# Patient Record
Sex: Female | Born: 1950 | Race: White | Hispanic: No | Marital: Married
Health system: Midwestern US, Community
[De-identification: ages and names within clinical notes are randomized; demographics above are authoritative.]

## PROBLEM LIST (undated history)

## (undated) DIAGNOSIS — E785 Hyperlipidemia, unspecified: Secondary | ICD-10-CM

## (undated) DIAGNOSIS — E039 Hypothyroidism, unspecified: Secondary | ICD-10-CM

## (undated) DIAGNOSIS — F32A Depression, unspecified: Secondary | ICD-10-CM

## (undated) DIAGNOSIS — F419 Anxiety disorder, unspecified: Secondary | ICD-10-CM

## (undated) DIAGNOSIS — I10 Essential (primary) hypertension: Secondary | ICD-10-CM

---

## 2011-12-30 LAB — CBC WITH AUTO DIFFERENTIAL
Basophils %: 0.8 % (ref 0–1)
Basophils Absolute: 0 10*3/uL (ref 0.0–0.1)
Eosinophils %: 3.5 % (ref 0–4)
Eosinophils Absolute: 0.2 10*3/uL (ref 0.0–0.6)
Hematocrit: 39.3 % (ref 37–47)
Hemoglobin: 13.2 GM/DL (ref 12.0–16.0)
Lymphocytes %: 44.5 % — ABNORMAL HIGH (ref 22–40)
Lymphocytes Absolute: 2.5 10*3/uL (ref 0.6–4.3)
MCH: 30.3 PG (ref 28–34)
MCHC: 33.5 % (ref 32.0–36.0)
MCV: 90.3 FL (ref 82–101)
MPV: 8.3 FL (ref 7.5–11.1)
Monocytes %: 6.1 % (ref 5–9)
Monocytes Absolute: 0.3 10*3/uL (ref 0.2–1.1)
Platelets: 387 10*3/uL (ref 140–440)
RBC: 4.35 10*6/uL (ref 4.2–5.5)
RDW: 13.7 % (ref 11.7–14.9)
Segs Absolute: 2.5 10*3/uL (ref 1.5–7.0)
Segs Relative: 45.1 % — ABNORMAL LOW (ref 49–69)
WBC: 5.6 10*3/uL (ref 4.5–11.0)

## 2011-12-30 LAB — COMPREHENSIVE METABOLIC PANEL
ALT: 38 U/L — ABNORMAL HIGH (ref 7–35)
AST: 30 U/L (ref 8–33)
Albumin: 4.8 G/DL (ref 3.4–4.8)
Alkaline Phosphatase: 69 U/L (ref 25–100)
BUN: 13 mg/dL (ref 6–23)
CO2: 25 mMol/L (ref 20–32)
Calcium: 9.7 mg/dL (ref 8.3–10.6)
Chloride: 103 mMol/L (ref 99–109)
Creatinine: 0.6 mg/dL (ref 0.6–1.1)
GFR African American: >60 mL/min/{1.73_m2}
GFR Non-African American: >60 mL/min/{1.73_m2}
Glucose, GTT - Fasting: 83 MG/DL (ref 70–99)
Potassium: 4.4 MMOL/L (ref 3.5–5.1)
Sodium: 138 mmol/L (ref 136–145)
Total Bilirubin: 0.4 mg/dL (ref 0.3–1.2)
Total Protein: 7.5 g/dL (ref 6.4–8.3)

## 2011-12-30 LAB — URINALYSIS
Bacteria, UA: NEGATIVE /HPF
Bilirubin Urine: NEGATIVE MG/DL
Blood, Urine: NEGATIVE
Cast Type: NONE SEEN /LPF
Crystal Type: NONE SEEN /HPF
Epithelial Cells, UA: NONE SEEN /HPF
Glucose, Urine: NEGATIVE MG/DL
Ketones, Urine: NEGATIVE MG/DL
Leukocyte Esterase, Urine: NEGATIVE
Nitrite Urine, Quantitative: NEGATIVE
Protein, UA: NEGATIVE MG/DL
RBC, UA: NONE SEEN /HPF (ref 0–6)
Specific Gravity, UA: 1.006 (ref 1.001–1.035)
Urobilinogen, Urine: 0.2 EU/DL (ref 0.2–1.0)
Volume, (UVOL): 12 ML (ref 10–12)
WBC, UA: 1 /HPF (ref 0–5)
pH, Urine: 5.5 (ref 5.0–8.0)

## 2011-12-30 LAB — TSH: TSH, High Sensitivity: 2.53 u[IU]/mL (ref 0.55–4.78)

## 2011-12-30 LAB — LIPID PANEL
Cholesterol: 189 MG/DL (ref <200)
HDL: 65 MG/DL (ref >60)
LDL Direct: 105 MG/DL — ABNORMAL HIGH (ref <100)
Triglycerides: 139 MG/DL (ref <150)

## 2011-12-30 LAB — T4, FREE: T4 Free: 1.4 NG/DL (ref 0.89–1.76)

## 2011-12-30 MED ORDER — TOPIRAMATE 25 MG PO TABS
25 MG | ORAL_TABLET | Freq: Every evening | ORAL | Status: DC
Start: 2011-12-30 — End: 2012-03-16

## 2011-12-30 MED ORDER — DIAZEPAM 5 MG PO TABS
5 MG | ORAL_TABLET | Freq: Two times a day (BID) | ORAL | Status: DC | PRN
Start: 2011-12-30 — End: 2012-04-15

## 2011-12-30 MED ORDER — FENOFIBRATE 145 MG PO TABS
145 MG | ORAL_TABLET | Freq: Every day | ORAL | Status: DC
Start: 2011-12-30 — End: 2012-04-15

## 2011-12-30 MED ORDER — EPINEPHRINE 0.3 MG/0.3ML IJ DEVI
0.3 MG/ML | INTRAMUSCULAR | Status: DC
Start: 2011-12-30 — End: 2013-04-03

## 2011-12-30 MED ORDER — FLUOXETINE HCL 10 MG PO CAPS
10 MG | ORAL_CAPSULE | Freq: Every day | ORAL | Status: DC
Start: 2011-12-30 — End: 2012-04-15

## 2011-12-30 MED ORDER — LEVOTHYROXINE SODIUM 125 MCG PO TABS
125 MCG | ORAL_TABLET | Freq: Every day | ORAL | Status: DC
Start: 2011-12-30 — End: 2012-04-15

## 2011-12-30 MED ORDER — ENALAPRIL MALEATE 10 MG PO TABS
10 MG | ORAL_TABLET | Freq: Every day | ORAL | Status: DC
Start: 2011-12-30 — End: 2012-04-15

## 2011-12-30 MED ORDER — ESOMEPRAZOLE MAGNESIUM 40 MG PO CPDR
40 MG | ORAL_CAPSULE | Freq: Every day | ORAL | Status: DC
Start: 2011-12-30 — End: 2012-04-15

## 2011-12-30 NOTE — Progress Notes (Signed)
Subjective:      Patient ID: Kimberly Morrow is a 61 y.o. female.    HPI Comments: Pt presenting today to get established in the new office.  States she continues to be under a tremendous amt of stress with her husband's poor health, her own weight issues, regular headaches, fatigue and shortness of breath.  She is not able to exercise since fracturing her right ankle last August and has been unable to lose weight despite trying to "eat healthy."  She is taking her medications as prescribed and does not want to make any changes to her Prozac currently.  She is very fatigued and tearful.  She wants to move to [place] permanently.  She is having some spasmodic pain in her bladder when she is urinating.  Dr. Delray Alt ordered a urine dip in his office last week and per pt was told it was "negative."  She is still having symptoms.  She denies hematuria.  She is drinking tea and vegetable shakes daily.      Review of Systems   Constitutional: Positive for activity change (decreased) and fatigue. Negative for unexpected weight change.   HENT: Negative for congestion and mouth sores.    Respiratory: Negative for cough.    Cardiovascular: Negative for chest pain.   Gastrointestinal: Positive for abdominal pain (lower, suprapubic).   Genitourinary: Positive for dysuria and urgency. Negative for hematuria and difficulty urinating.   Musculoskeletal: Positive for joint swelling (left ankle region). Negative for back pain.   Allergic/Immunologic: Positive for food allergies.   Neurological: Positive for headaches. Negative for dizziness and weakness.   Psychiatric/Behavioral: Positive for dysphoric mood and agitation. Negative for suicidal ideas and sleep disturbance. The patient is nervous/anxious.        Objective:   Physical Exam   Constitutional: She is oriented to person, place, and time. She appears well-developed and well-nourished. No distress.   HENT:   Head: Normocephalic and atraumatic.   Neck: Normal range of motion.  Neck supple. No thyromegaly present.   Cardiovascular: Normal rate, regular rhythm and normal heart sounds.    No murmur heard.  Pulmonary/Chest: Effort normal and breath sounds normal.   Abdominal: Soft. There is no tenderness.   Musculoskeletal: Normal range of motion. She exhibits edema (left ankle with mild non-pitting edema) and tenderness.   Negative Homan's sign   Neurological: She is alert and oriented to person, place, and time.   Skin: Skin is warm and dry.   Psychiatric: Her mood appears anxious. Her speech is not rapid and/or pressured. She is agitated. She expresses impulsivity. She expresses no suicidal ideation. She expresses no suicidal plans.   teaerful       1. Fatigue  Vitamin D 25 hydroxy   2. Anxiety and depression  FLUoxetine (PROZAC) 10 MG capsule, diazepam (VALIUM) 5 MG tablet   3. Unspecified hypothyroidism  levothyroxine (SYNTHROID) 125 MCG tablet, TSH, T4, free   4. Unspecified essential hypertension  enalapril (VASOTEC) 10 MG tablet, Comprehensive metabolic panel, CBC Auto Differential, Urinalysis   5. GERD (gastroesophageal reflux disease)  esomeprazole (NEXIUM) 40 MG capsule   6. Other and unspecified hyperlipidemia  fenofibrate (TRICOR) 145 MG tablet, Lipid panel   7. Bee sting-induced anaphylaxis  EPINEPHrine (EPIPEN) 0.3 MG/0.3ML DEVI injection   8. SOB (shortness of breath)  US Duplex Lower Extremity Left Ven   9. Migraine headache  topiramate (TOPAMAX) 25 MG tablet   10. Cystitis, acute        Past medical, surgical,  social, family, medication and vaccination history updated today in the chart.  Pt's multiple medical concerns were addressed today in a 50 minute visit.  She is due for labs and medication refills which were ordered and done today.  She will have [place] of her left lower extremity due to her complaints of left leg edema and shortness of breath.  I do not think she has a PE, however since she recently had cardiac w/u in the last year with Dr. Nicole Cella and was told that her  source of SOB is not cardiac, I should also work to r/o PE.  I feel that most of her symptoms are anxiety related at this time.  She will start Topamax low dose at hs for her migraine headache prophylaxis which may help with her appetite suppression as well.  She will try to resume some form of walking type exercise as well.  She has been given an Rx for epi pen and she is to avoid almond milk and eggs if she feels they are making her cough and feel like her throat is closing.  I will notify her of lab results.  She is agreeable to plan.  I will see her back in 1 month.  Discuss cancer screening at our next visit.

## 2011-12-30 NOTE — Patient Instructions (Signed)
Thank you for enrolling in MyChart. Please follow the instructions below to securely access your online medical record. MyChart allows you to send messages to your doctor, view your test results, renew your prescriptions, schedule appointments, and more.     How Do I Sign Up?  1. In your Internet browser, go to [email or web address]  2. Click on the Sign Up Now link in the Sign In box. You will see the New Member Sign Up page.  3. Enter your MyChart Access Code exactly as it appears below. You will not need to use this code after you???ve completed the sign-up process. If you do not sign up before the expiration date, you must request a new code.  MyChart Access Code: [email or web address]  Expires: 02/28/2012  1:38 PM    4. Enter your Social Security Number ([id number]) and Date of Birth (mm/dd/yyyy) as indicated and click Submit. You will be taken to the next sign-up page.  5. Create a MyChart ID. This will be your MyChart login ID and cannot be changed, so think of one that is secure and easy to remember.  6. Create a MyChart password. You can change your password at any time.  7. Enter your Password Reset Question and Answer. This can be used at a later time if you forget your password.   8. Enter your e-mail address. You will receive e-mail notification when new information is available in MyChart.  9. Click Sign Up. You can now view your medical record.     Additional Information  If you have questions, please contact your physician practice where you receive care. Remember, MyChart is NOT to be used for urgent needs. For medical emergencies, dial 911.

## 2012-01-01 LAB — CULTURE, URINE: Culture: NO GROWTH

## 2012-01-01 LAB — VITAMIN D 25 HYDROXY: Vit D, 25-Hydroxy: 27 — ABNORMAL LOW

## 2012-01-06 NOTE — Telephone Encounter (Signed)
Pt wanting to know if she can ger an Rx of Valium 5 mg.  Looks like an Rx was written on 12-30-2011 with 5 refills.  I will call the pharmacy and see if it is there.

## 2012-01-06 NOTE — Telephone Encounter (Signed)
There are no refills at the pharmacy.  Pt needs Rx

## 2012-01-06 NOTE — Telephone Encounter (Signed)
Patient called and states she does have RX.

## 2012-01-06 NOTE — Telephone Encounter (Signed)
Pt to look for the Rx that I printed out for her at her visit last week.  Thanks.

## 2012-01-06 NOTE — Telephone Encounter (Signed)
Patient will look and call us back

## 2012-03-16 MED ORDER — ERGOCALCIFEROL 1.25 MG (50000 UT) PO CAPS
1.25 MG (50000 UT) | ORAL_CAPSULE | ORAL | Status: AC
Start: 2012-03-16 — End: ?

## 2012-03-16 MED ORDER — TOPIRAMATE 50 MG PO TABS
50 MG | ORAL_TABLET | Freq: Every evening | ORAL | Status: DC
Start: 2012-03-16 — End: 2012-04-15

## 2012-03-16 NOTE — Progress Notes (Signed)
Subjective:      Patient ID: Kimberly Morrow is a 61 y.o. female.    HPI Comments: Pt has tolerated the Topamax well.  She feels it has really helped her with her sleep but she is still waking up at 3am, but the sleep she is so much better.  The thoughts are quieted down when she is falling asleep.  Pt is feeling a "burning in my neck."  Feels that her thyroid is starting to "regrow."  She is hoarse and difficult to swallow.  Feels she needs to have her "throat stretched."      Review of Systems   Constitutional: Positive for appetite change and fatigue. Negative for unexpected weight change.   HENT: Positive for trouble swallowing, neck pain ("burning where my thyroid used to be") and voice change. Negative for congestion, sore throat and mouth sores.    Respiratory: Positive for shortness of breath (with increased anxiety). Negative for cough.    Cardiovascular: Negative for chest pain.   Gastrointestinal: Negative for abdominal pain.   Genitourinary: Negative for difficulty urinating.   Musculoskeletal: Negative for back pain.   Neurological: Negative for weakness.   Psychiatric/Behavioral: Positive for sleep disturbance and dysphoric mood. Negative for suicidal ideas. The patient is nervous/anxious.        Objective:   Physical Exam   Vitals reviewed.  Constitutional: She is oriented to person, place, and time. She appears well-developed and well-nourished. No distress.   HENT:   Head: Normocephalic and atraumatic.   Neck: Normal range of motion. Neck supple. No thyromegaly (no neck mass appreciated) present.   Cardiovascular: Normal rate and regular rhythm.    No murmur heard.  Pulmonary/Chest: Effort normal and breath sounds normal.   Abdominal: Soft. There is no tenderness.   Musculoskeletal: Normal range of motion.   Neurological: She is alert and oriented to person, place, and time.   Skin: Skin is warm and dry.   Psychiatric: She has a normal mood and affect. Her behavior is normal.       1. Vitamin D  deficiency  ergocalciferol (ERGOCALCIFEROL) 50000 UNITS capsule   2. Migraine headache  topiramate (TOPAMAX) 50 MG tablet   3. Anxiety and depression     4. GERD (gastroesophageal reflux disease)  Ambulatory referral to Gastroenterology   5. Post-surgical hypothyroidism  Amb External Referral To Endocrinology   6. Voice hoarseness  Amb External Referral To Endocrinology, Ambulatory referral to Gastroenterology   7. Need for influenza vaccination  Flu Vaccine greater than or = 63yo IM     Pt will start ergocalciferol at 50,000 units weekly for her vitamin D def.  She will increase Topamax to 50mg  at hs in hopes of improved sleep, mood and headache stablility.  She will go to see Dr. Lenis Noon Appalaneni for GI eval and she will reconnect with Dr. Laurene Footman for evaluation of her history of thyroid cancer and burning in neck and voice hoarseness.  She will have flu vaccination today and I will see her back in 1 month for a recheck.  Pt is agreeable to plan.

## 2012-04-15 ENCOUNTER — Encounter

## 2012-04-15 MED ORDER — ENALAPRIL MALEATE 10 MG PO TABS
10 MG | ORAL_TABLET | Freq: Every day | ORAL | Status: DC
Start: 2012-04-15 — End: 2012-11-04

## 2012-04-15 MED ORDER — LEVOTHYROXINE SODIUM 125 MCG PO TABS
125 MCG | ORAL_TABLET | Freq: Every day | ORAL | Status: DC
Start: 2012-04-15 — End: 2013-03-17

## 2012-04-15 MED ORDER — DIAZEPAM 5 MG PO TABS
5 MG | ORAL_TABLET | Freq: Two times a day (BID) | ORAL | Status: DC | PRN
Start: 2012-04-15 — End: 2012-11-30

## 2012-04-15 MED ORDER — TOPIRAMATE 50 MG PO TABS
50 MG | ORAL_TABLET | Freq: Every evening | ORAL | Status: DC
Start: 2012-04-15 — End: 2012-11-30

## 2012-04-15 MED ORDER — ESOMEPRAZOLE MAGNESIUM 40 MG PO CPDR
40 MG | ORAL_CAPSULE | Freq: Every day | ORAL | Status: AC
Start: 2012-04-15 — End: ?

## 2012-04-15 MED ORDER — FENOFIBRATE 145 MG PO TABS
145 MG | ORAL_TABLET | Freq: Every day | ORAL | Status: DC
Start: 2012-04-15 — End: 2013-01-19

## 2012-04-15 MED ORDER — FLUOXETINE HCL 10 MG PO CAPS
10 MG | ORAL_CAPSULE | Freq: Every day | ORAL | Status: DC
Start: 2012-04-15 — End: 2012-11-30

## 2012-04-15 NOTE — Telephone Encounter (Signed)
Pt called states that she would like all her medication name brand. she states that she will pay the difference in price. Pt was wondering if she can get enough prescriptions to last awhile why she is out of town. She has tried the generic on blood pressure and it kept her BP up. She will pick the prescriptions up.

## 2012-04-15 NOTE — Telephone Encounter (Signed)
Pt notified Rx's are ready for pickup

## 2012-04-15 NOTE — Telephone Encounter (Signed)
Done.  Pt can come to pick up her prescriptions when she is ready.

## 2012-11-03 NOTE — Telephone Encounter (Signed)
Per Dr. Patsi SearsSandor called into #90[facility] [place]

## 2012-11-03 NOTE — Telephone Encounter (Signed)
Pt needs a refill on her Enalapril. It needs to be written as the generic because her insurance won't fill the Vasotec. Pt took her last one today. Her and her husband made appt to see you on 12/07/12

## 2012-11-04 ENCOUNTER — Encounter

## 2012-11-04 MED ORDER — ENALAPRIL MALEATE 10 MG PO TABS
10 MG | ORAL_TABLET | Freq: Every day | ORAL | Status: DC
Start: 2012-11-04 — End: 2014-01-16

## 2012-11-30 LAB — T4, FREE: T4 Free: 1.57 NG/DL (ref 0.89–1.76)

## 2012-11-30 LAB — COMPREHENSIVE METABOLIC PANEL
ALT: 35 U/L (ref 7–35)
AST: 33 U/L (ref 8–33)
Albumin: 4.4 G/DL (ref 3.4–4.8)
Alkaline Phosphatase: 75 U/L (ref 25–100)
BUN: 13 mg/dL (ref 6–23)
CO2: 26 mMol/L (ref 20–32)
Calcium: 9.9 mg/dL (ref 8.3–10.6)
Chloride: 112 mMol/L — ABNORMAL HIGH (ref 99–109)
Creatinine: 0.6 mg/dL (ref 0.6–1.1)
GFR African American: >60 mL/min/{1.73_m2}
GFR Non-African American: >60 mL/min/{1.73_m2}
Glucose, GTT - Fasting: 92 MG/DL (ref 70–99)
Potassium: 4.5 MMOL/L (ref 3.5–5.1)
Sodium: 143 mmol/L (ref 136–145)
Total Bilirubin: 0.3 mg/dL (ref 0.3–1.2)
Total Protein: 7.5 g/dL (ref 6.4–8.3)

## 2012-11-30 LAB — CBC WITH AUTO DIFFERENTIAL
Basophils %: 1.3 % — ABNORMAL HIGH (ref 0–1)
Basophils Absolute: 0.1 10*3/uL (ref 0.0–0.1)
Eosinophils %: 5.1 % — ABNORMAL HIGH (ref 0–4)
Eosinophils Absolute: 0.3 10*3/uL (ref 0.0–0.6)
Hematocrit: 38.9 % (ref 37–47)
Hemoglobin: 13.3 GM/DL (ref 12.0–16.0)
Lymphocytes %: 45.5 % — ABNORMAL HIGH (ref 22–40)
Lymphocytes Absolute: 2.2 10*3/uL (ref 0.6–4.3)
MCH: 31.1 PG (ref 28–34)
MCHC: 34.2 % (ref 32.0–36.0)
MCV: 91 FL (ref 82–101)
MPV: 8.1 FL (ref 7.5–11.1)
Monocytes %: 7.5 % (ref 5–9)
Monocytes Absolute: 0.4 10*3/uL (ref 0.2–1.1)
Platelets: 348 10*3/uL (ref 140–440)
RBC: 4.28 10*6/uL (ref 4.2–5.5)
RDW: 13.6 % (ref 11.7–14.9)
Segs Absolute: 2 10*3/uL (ref 1.5–7.0)
Segs Relative: 40.6 % — ABNORMAL LOW (ref 49–69)
WBC: 4.9 10*3/uL (ref 4.5–11.0)

## 2012-11-30 LAB — LIPID PANEL
Cholesterol: 171 MG/DL (ref <200)
HDL: 65 MG/DL (ref >60)
LDL Direct: 90 MG/DL (ref <100)
Triglycerides: 174 MG/DL — ABNORMAL HIGH (ref <150)

## 2012-11-30 LAB — URINALYSIS
Bacteria, UA: NEGATIVE /HPF
Bilirubin Urine: NEGATIVE MG/DL
Blood, Urine: NEGATIVE
Cast Type: NONE SEEN /LPF
Epithelial Cells, UA: NONE SEEN /HPF
Glucose, Urine: NEGATIVE MG/DL
Ketones, Urine: NEGATIVE MG/DL
Leukocyte Esterase, Urine: NEGATIVE
Mucus, UA: NEGATIVE
Nitrite Urine, Quantitative: NEGATIVE
Protein, UA: NEGATIVE MG/DL
RBC, UA: NONE SEEN /HPF (ref 0–6)
Specific Gravity, UA: 1.01 (ref 1.001–1.035)
Urobilinogen, Urine: 0.2 EU/DL (ref 0.2–1.0)
Volume, (UVOL): 12 ML (ref 10–12)
WBC, UA: 4 /HPF (ref 0–5)
pH, Urine: 7 (ref 5.0–8.0)

## 2012-11-30 LAB — T3, FREE: T3, Free: 2.8 PG/ML (ref 2.3–4.2)

## 2012-11-30 LAB — TSH: TSH, High Sensitivity: 0.272 u[IU]/mL — ABNORMAL LOW (ref 0.55–4.78)

## 2012-11-30 MED ORDER — DIAZEPAM 5 MG PO TABS
5 MG | ORAL_TABLET | Freq: Two times a day (BID) | ORAL | Status: DC | PRN
Start: 2012-11-30 — End: 2013-04-03

## 2012-11-30 MED ORDER — FLUOXETINE HCL 20 MG PO CAPS
20 MG | ORAL_CAPSULE | Freq: Every day | ORAL | Status: AC
Start: 2012-11-30 — End: ?

## 2012-11-30 MED ORDER — TOPIRAMATE 25 MG PO TABS
25 MG | ORAL_TABLET | ORAL | Status: DC
Start: 2012-11-30 — End: 2013-04-03

## 2012-11-30 NOTE — Patient Instructions (Addendum)
The 5 Love Languages -Santina Evans  The Stop Walking on [employer] Work Book  The Dance of Anger- Candise Che, PsyD [phone]
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
## 2012-12-02 LAB — VITAMIN D 25 HYDROXY: Vit D, 25-Hydroxy: 31

## 2013-01-19 ENCOUNTER — Encounter

## 2013-01-19 MED ORDER — FENOFIBRATE 145 MG PO TABS
145 MG | ORAL_TABLET | Freq: Every day | ORAL | Status: DC
Start: 2013-01-19 — End: 2013-06-30

## 2013-01-19 NOTE — Telephone Encounter (Signed)
The pharmacy called about pt tri core.  Pt insurance requires a prior auth for brand name and wanted to know if it was ok to switch to generic. LM for pt to call the office to see if it is ok to switch.

## 2013-01-19 NOTE — Telephone Encounter (Signed)
Pt called back state it is ok to change to generic.  Pharmacy notified

## 2013-03-17 ENCOUNTER — Encounter

## 2013-03-17 MED ORDER — LEVOTHYROXINE SODIUM 125 MCG PO TABS
125 MCG | ORAL_TABLET | Freq: Every day | ORAL | Status: DC
Start: 2013-03-17 — End: 2013-06-30

## 2013-03-17 NOTE — Telephone Encounter (Signed)
Rx sent to the pharmacy

## 2013-04-03 MED ORDER — DIAZEPAM 5 MG PO TABS
5 MG | ORAL_TABLET | Freq: Two times a day (BID) | ORAL | Status: DC | PRN
Start: 2013-04-03 — End: 2013-06-30

## 2013-04-03 MED ORDER — EPINEPHRINE 0.3 MG/0.3ML IJ SOAJ
0.3 MG/ML | INTRAMUSCULAR | Status: AC
Start: 2013-04-03 — End: ?

## 2013-04-03 MED ORDER — TRAZODONE HCL 50 MG PO TABS
50 MG | ORAL_TABLET | ORAL | Status: AC
Start: 2013-04-03 — End: ?
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
## 2013-07-01 MED ORDER — DIAZEPAM 5 MG PO TABS
5 MG | ORAL_TABLET | ORAL | Status: DC
Start: 2013-07-01 — End: 2013-09-10

## 2013-07-01 MED ORDER — SYNTHROID 125 MCG PO TABS
125 MCG | ORAL_TABLET | ORAL | Status: DC
Start: 2013-07-01 — End: 2013-09-10

## 2013-07-01 MED ORDER — FENOFIBRATE 145 MG PO TABS
145 MG | ORAL_TABLET | ORAL | Status: DC
Start: 2013-07-01 — End: 2014-02-04

## 2013-07-01 NOTE — Telephone Encounter (Signed)
Please call in Valium for patient to her pharmacy in [place].  Thanks.

## 2013-07-04 NOTE — Telephone Encounter (Signed)
Called pt to get the Pharmacy information.    [facility] [address] [phone].  Will call in when store open.

## 2013-07-04 NOTE — Telephone Encounter (Signed)
Rx called into the pharmacy

## 2013-09-11 MED ORDER — DIAZEPAM 5 MG PO TABS
5 MG | ORAL_TABLET | ORAL | Status: AC
Start: 2013-09-11 — End: ?

## 2013-09-11 MED ORDER — SYNTHROID 125 MCG PO TABS
125 MCG | ORAL_TABLET | ORAL | Status: DC
Start: 2013-09-11 — End: 2014-01-16

## 2013-09-11 NOTE — Telephone Encounter (Signed)
Please call in Valium to patient's pharmacy in [place].  Thanks.

## 2013-09-12 NOTE — Telephone Encounter (Signed)
Rx called into the pharmacy

## 2014-01-16 MED ORDER — ENALAPRIL MALEATE 10 MG PO TABS
10 MG | ORAL_TABLET | ORAL | Status: AC
Start: 2014-01-16 — End: ?

## 2014-01-16 MED ORDER — SYNTHROID 125 MCG PO TABS
125 MCG | ORAL_TABLET | ORAL | Status: AC
Start: 2014-01-16 — End: ?

## 2014-02-05 MED ORDER — FENOFIBRATE 145 MG PO TABS
145 MG | ORAL_TABLET | ORAL | Status: AC
Start: 2014-02-05 — End: ?

## 2014-02-05 NOTE — Telephone Encounter (Signed)
Pt will need to see me or get established with new PCP in [place].  Thanks.

## 2014-02-06 NOTE — Telephone Encounter (Signed)
I spoke with patient.  She is going to call back as soon as they know when they are coming back to [place] to schedule an appt.

## 2021-11-05 IMAGING — MG MAMMOGRAPHY SCREENING BILATERAL 3[PERSON_NAME]
8 series · 8 of 24 positions shown · non-contrast
Comparison: No prior examinations were available for comparison.

________________________________________________________________________________________________ 
MAMMOGRAPHY SCREENING BILATERAL 3ALEJTIN HELSH, 11/05/2021 [DATE]: 
CLINICAL INDICATION: Encounter for screening mammogram. New baseline screening 
mammogram. Previous bilateral breast reduction surgery.
TECHNIQUE: Digital bilateral mammograms and 3-D Tomosynthesis were obtained. 
These were interpreted both primarily and with the aid of computer-aided 
detection system.  
BREAST DENSITY: (Level C) The breasts are heterogeneously dense, which may 
obscure small masses.

[R MLO]
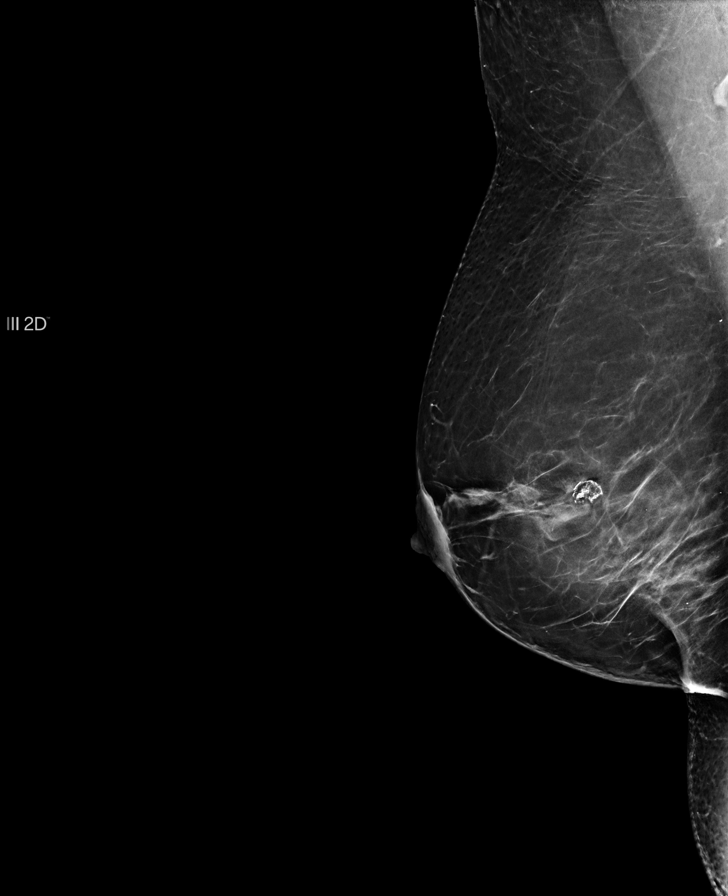

[R CC]
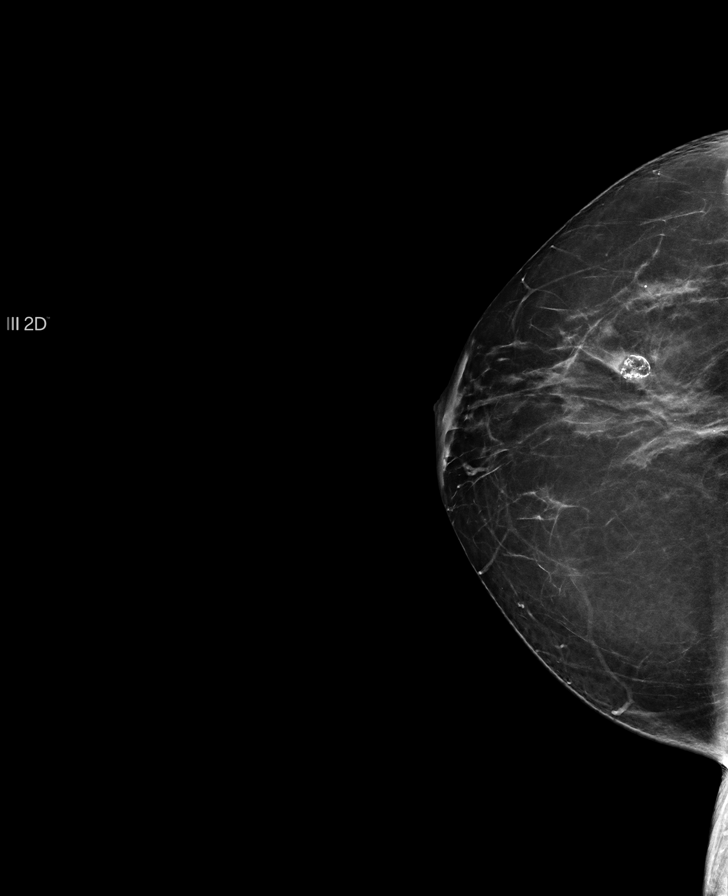

[L MLO]
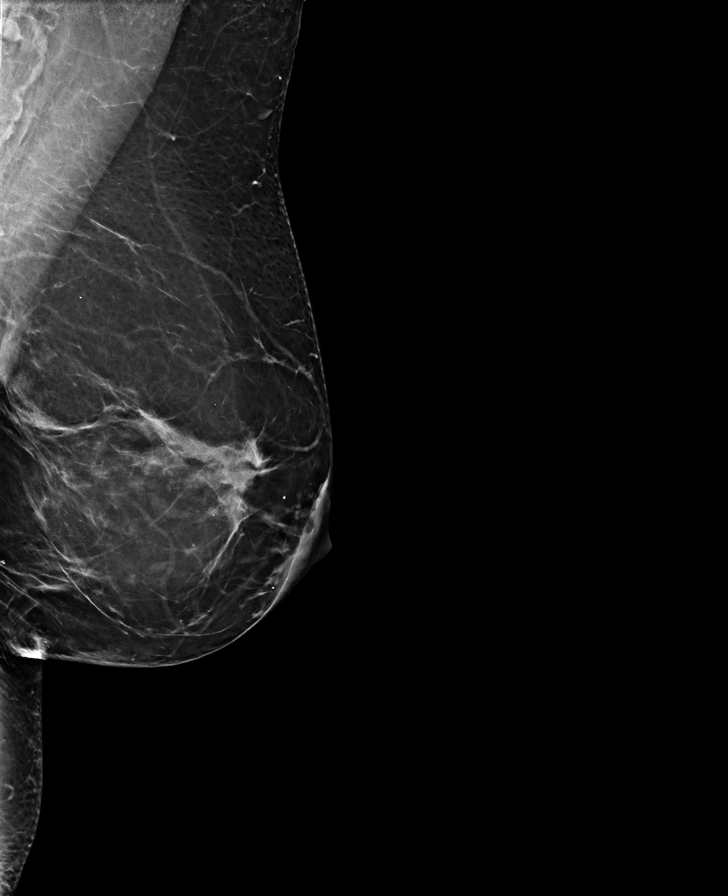

[L CC]
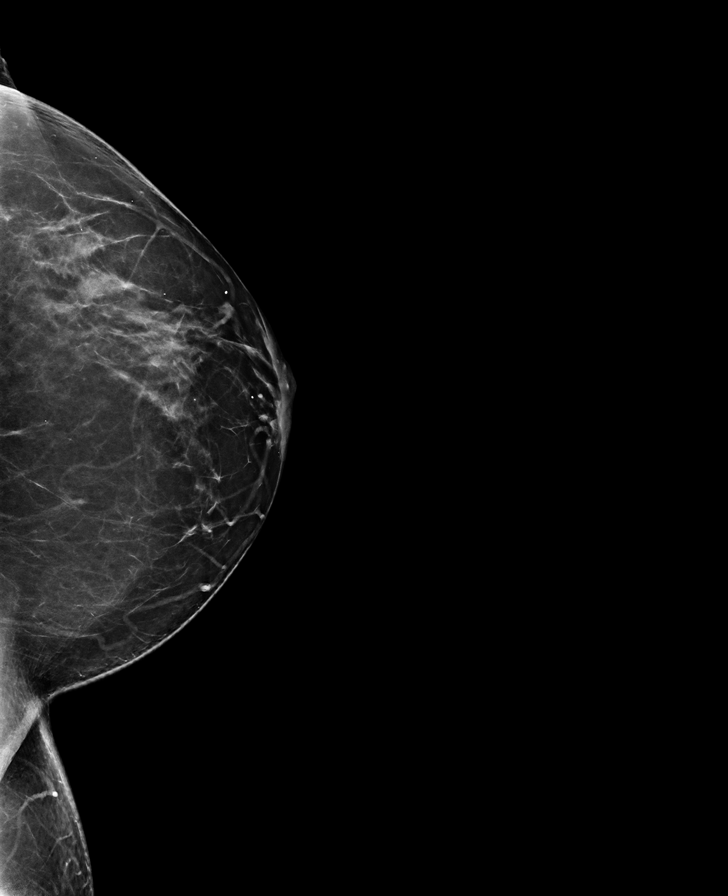

[R CC tomo · tomo slice 37/72.0]
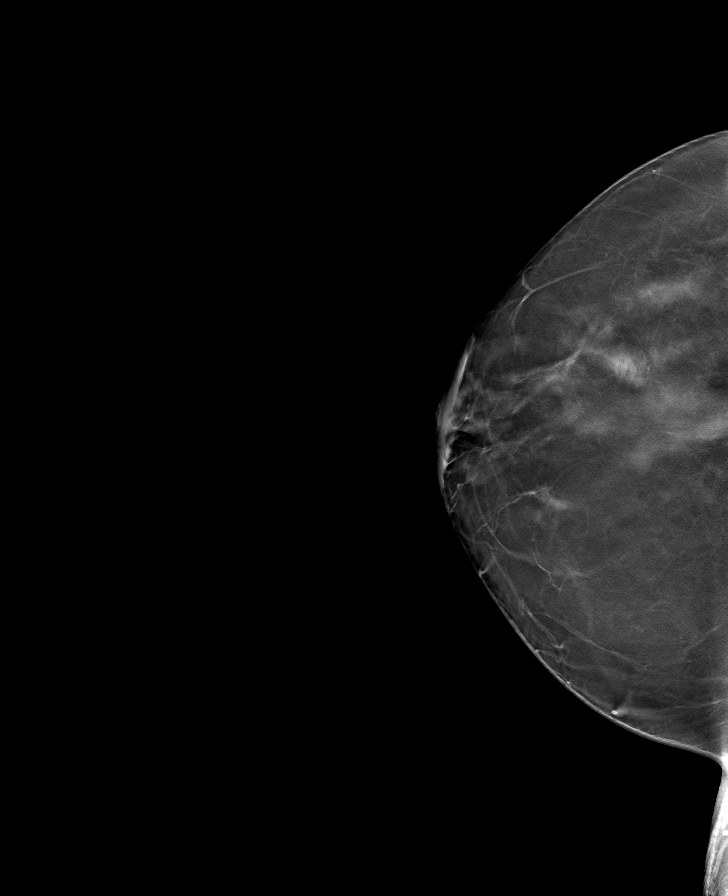

[L CC tomo · tomo slice 39/77.0]
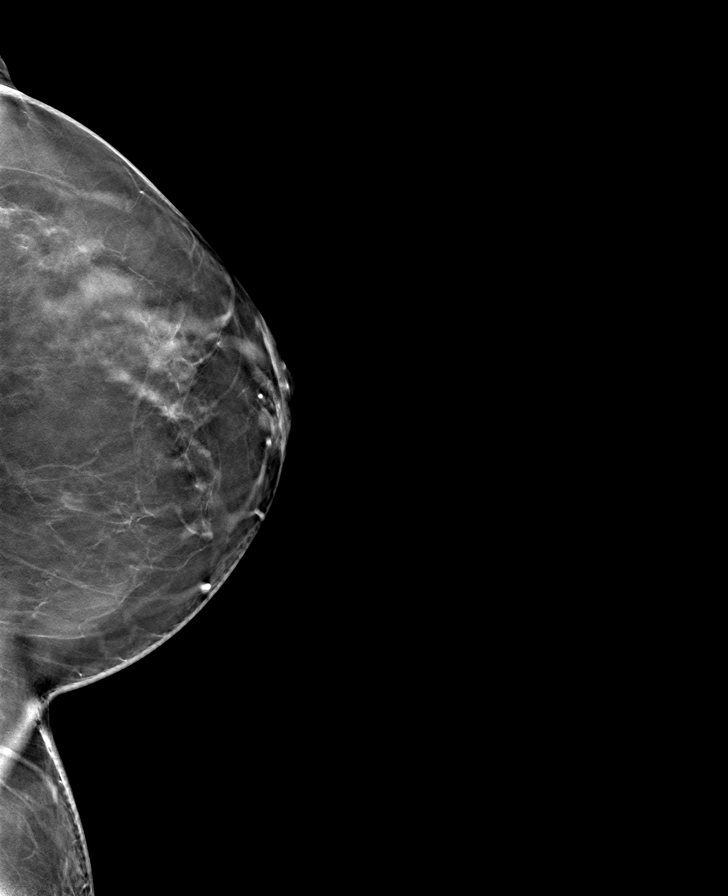

[L MLO tomo · tomo slice 43/85.0]
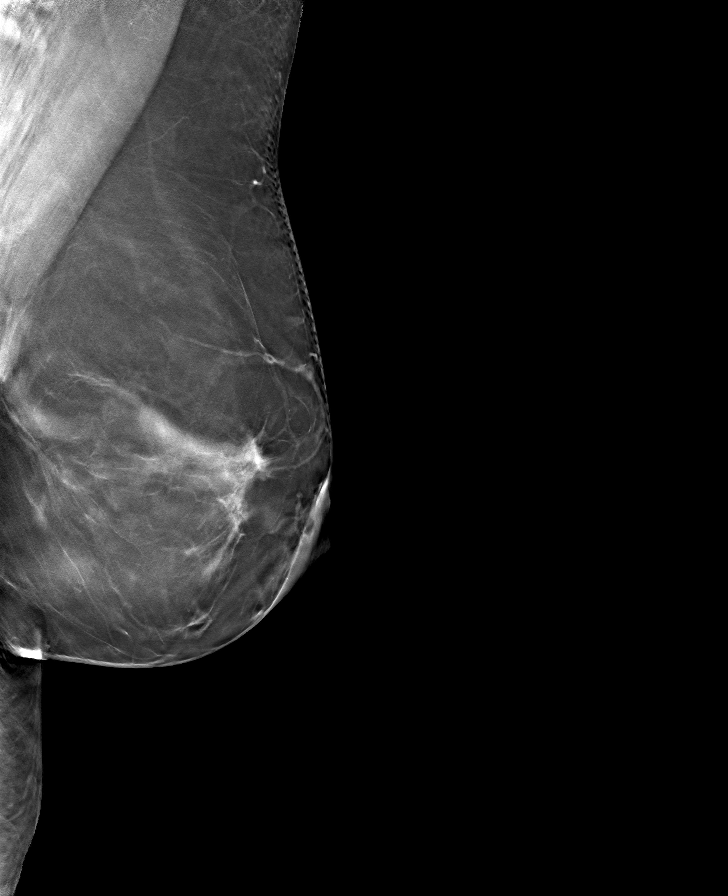

[R MLO tomo · tomo slice 45/88.0]
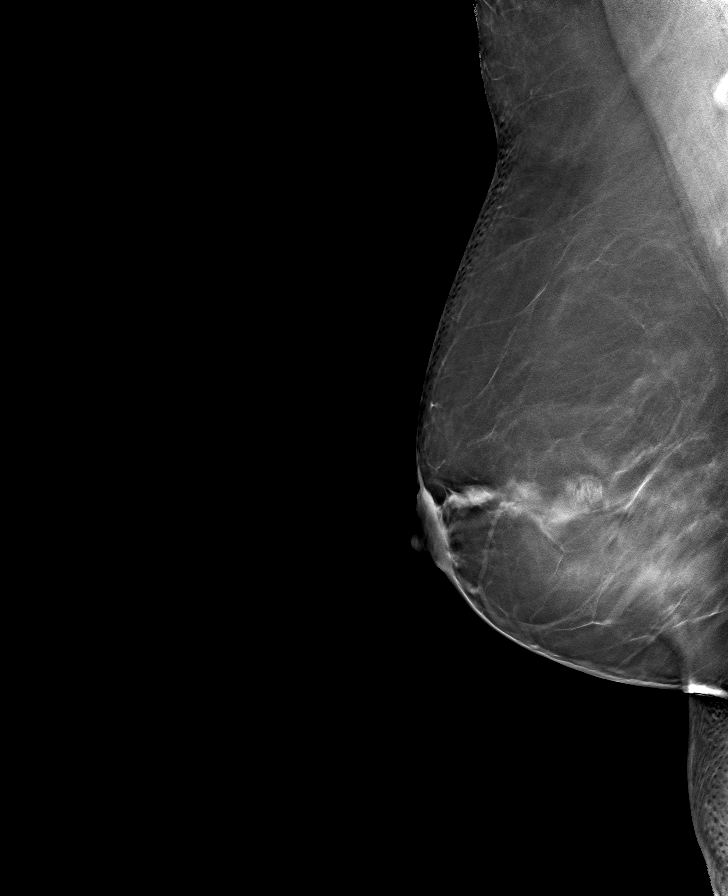

[8 of 24 positions shown; findings below may reference images not displayed]

Please note 
comparison with prior examinations increases the sensitivity for the detection 
of malignancy and decreases the false positive recall rate. If prior 
examinations are made available an addendum to this report will be generated.
FINDINGS: Benign macrocalcification fossa calcified oil cyst in the central 
right breast. Mild suspected scarring in both breasts likely related to previous 
breast reduction surgery and this will serve as new baseline mammogram. No 
suspicious mass or suspicious calcifications in either breast. Patient should 
return for routine yearly bilateral screening mammograms. This will serve as 
patients new baseline mammogram..
IMPRESSION: (BI-RADS 2) Benign findings. Routine mammographic follow-up is recommended.

## 2022-12-16 IMAGING — MR MRI CERVICAL SPINE WITHOUT CONTRAST
5 of 7 series · 23 of 48 positions shown · IV contrast (gadolinium)
Comparison: None.

________________________________________________________________________________________________ 
MRI CERVICAL SPINE WITHOUT CONTRAST, 12/16/2022 [DATE]: 
CLINICAL INDICATION: Chronic neck pain.
TECHNIQUE: Multiplanar, multiecho position MR images of the cervical spine were 
performed without intravenous gadolinium enhancement. Patient was scanned on a 
1.5T magnet.

[Series 101: survey · axial · 10.0mm · 1.25mm/px · z∈[-34,+149]mm · 3 of 10 slices shown]
[im 1/10]
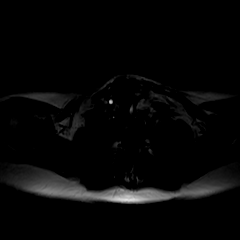
[im 5/10]
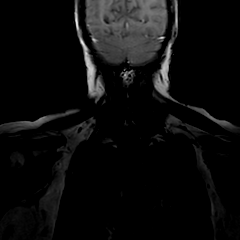
[im 10/10]
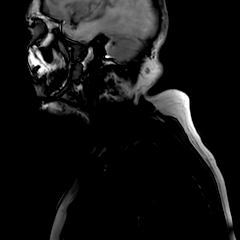

[Series 201: t2w_cor-surv · coronal · 5.0mm · 0.69mm/px · 2 of 7 slices shown]
[im 1/7]
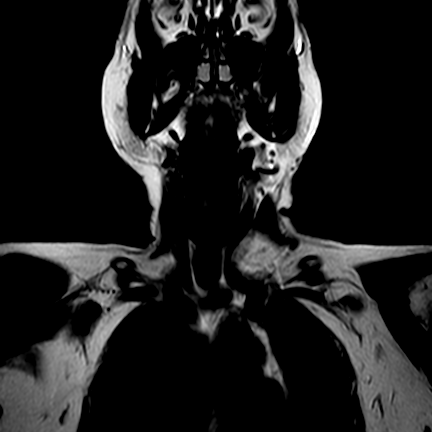
[im 7/7]
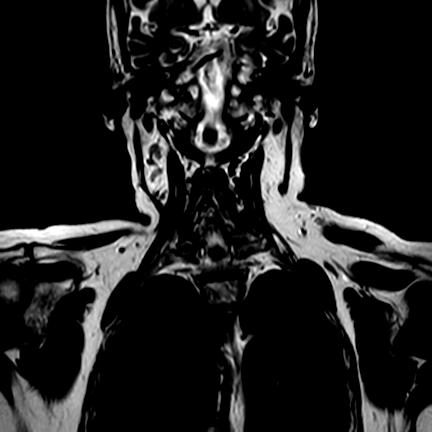

[Series 301: t1_sag · sagittal · 3.0mm · 0.39mm/px · 6 of 15 slices shown]
[im 1/15]
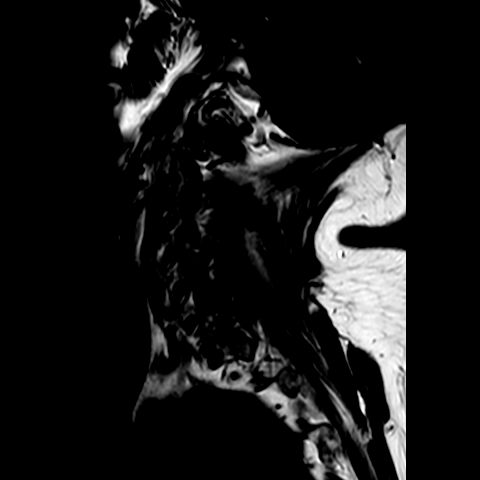
[im 3/15]
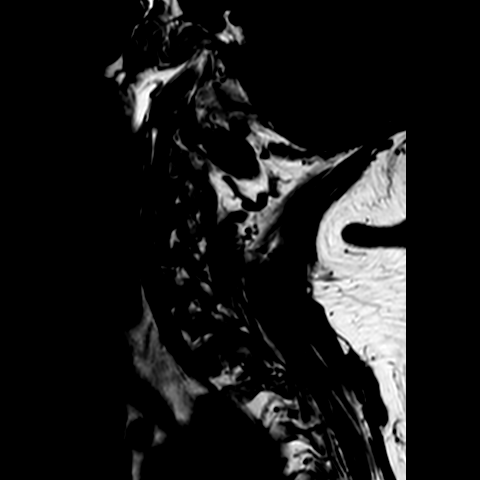
[im 6/15]
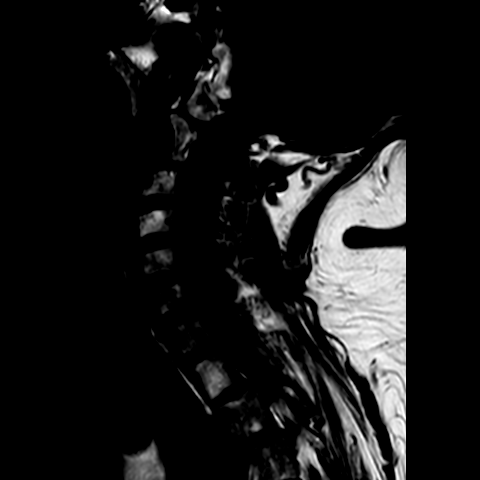
[im 9/15]
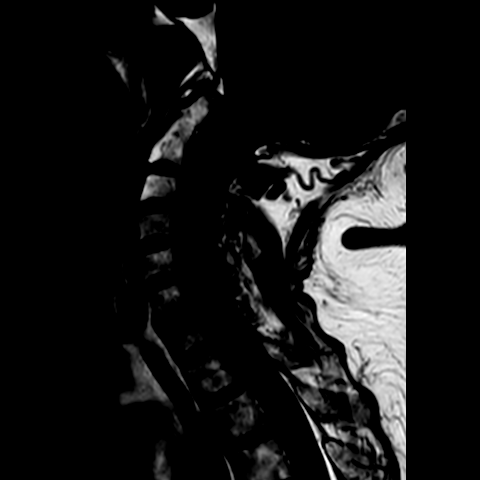
[im 12/15]
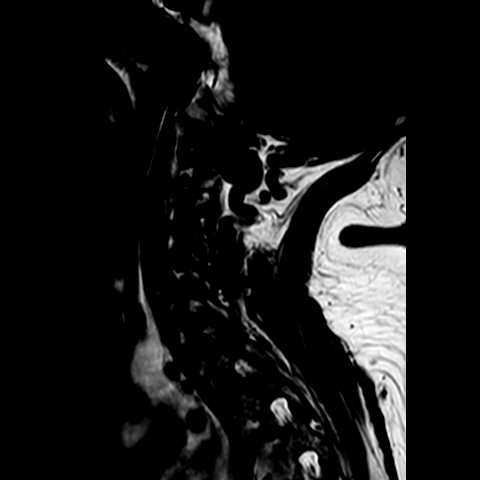
[im 15/15]
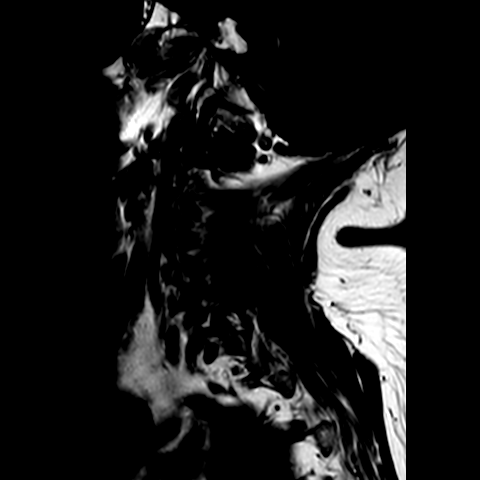

[Series 402: (id)_mdixon_tse · sagittal · 3.0mm · 0.42mm/px · 3 of 15 slices shown]
[im 1/15]
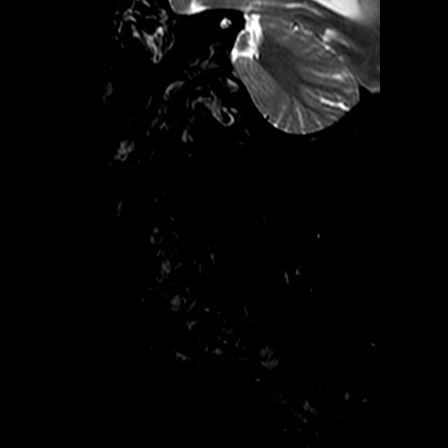
[im 3/15]
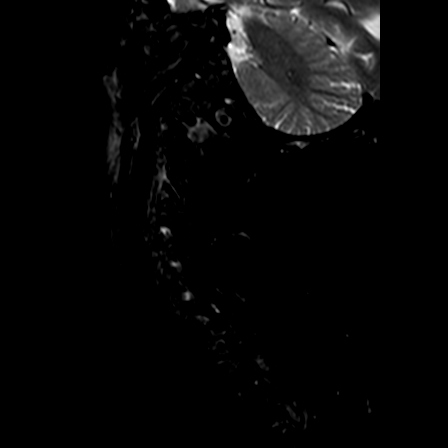
[im 6/15]
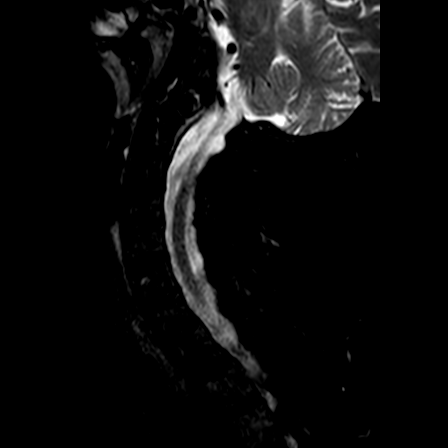

[Series 601: T2 · axial · 3.0mm · 0.31mm/px · z∈[-80,+15]mm · 9 of 32 slices shown]
[im 1/32]
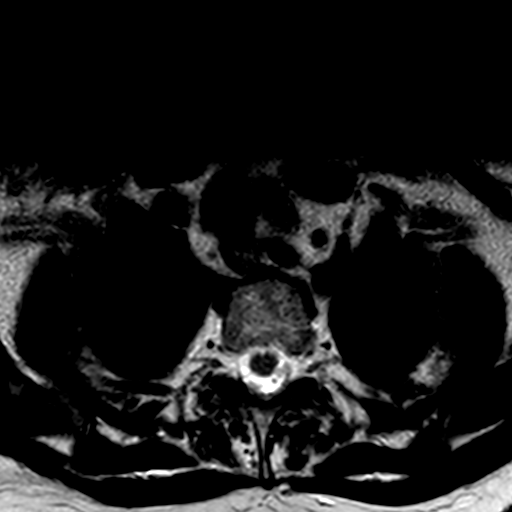
[im 6/32]
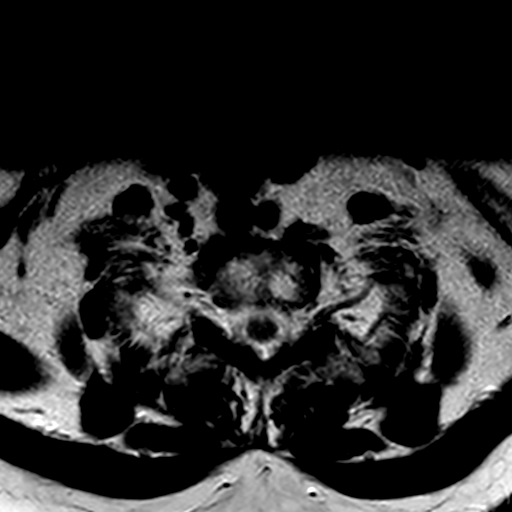
[im 9/32]
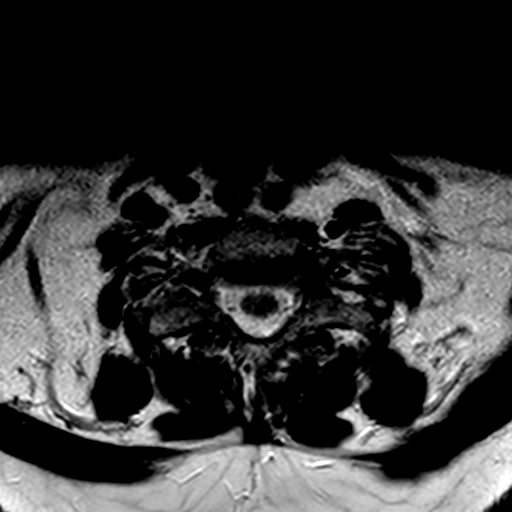
[im 15/32]
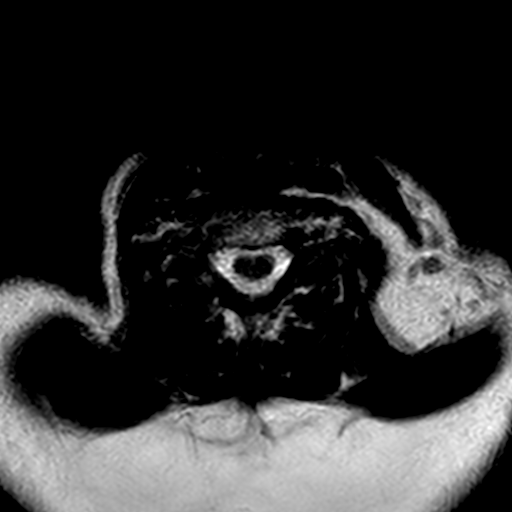
[im 17/32]
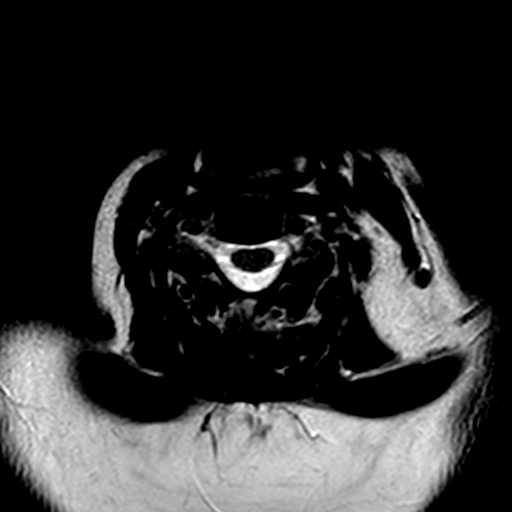
[im 23/32]
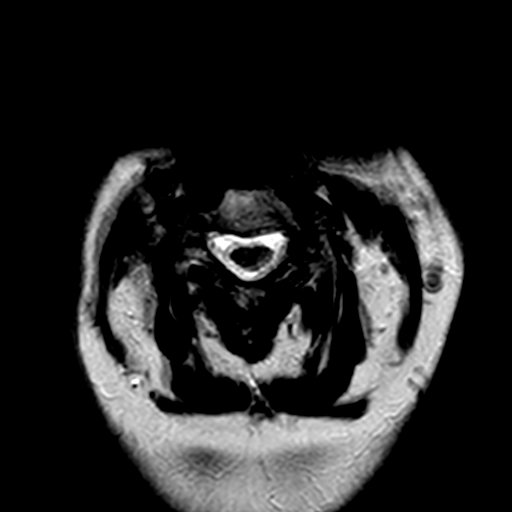
[im 26/32]
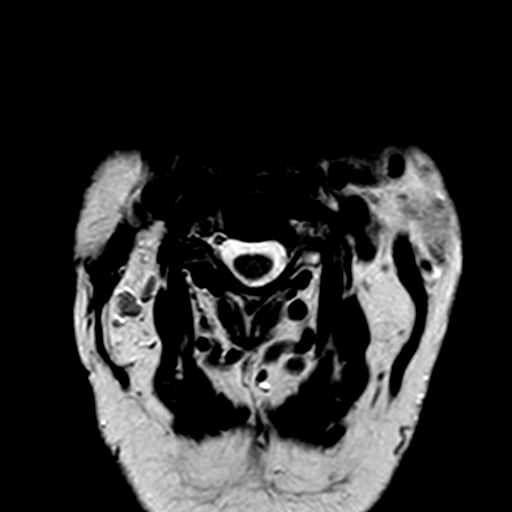
[im 29/32]
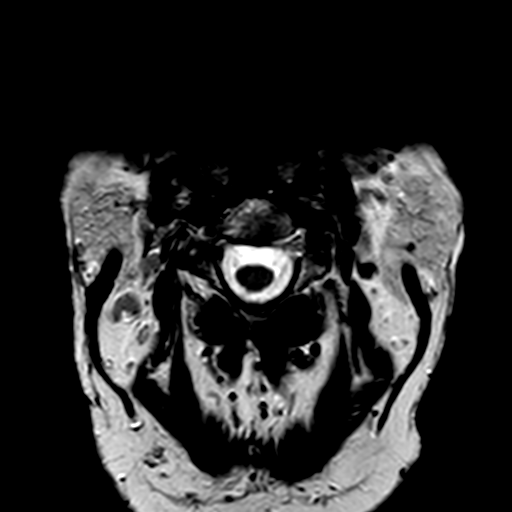
[im 32/32]
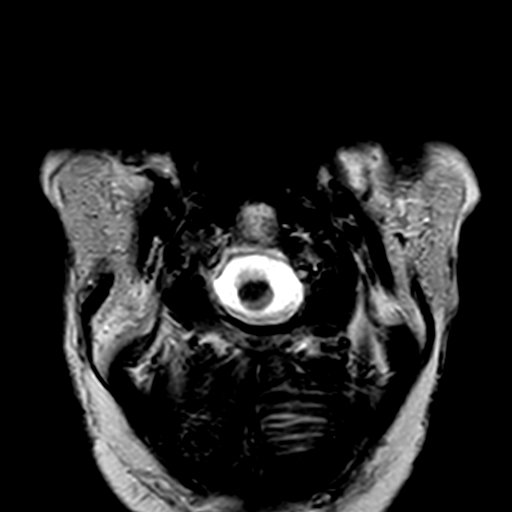

[23 of 48 positions shown; findings below may reference images not displayed]

FINDINGS: -------------------------------------------------------------------------------- 
----------------- 
GENERAL: 
ALIGNMENT: Very mild anterolisthesis of C3 on C4. 
VERTEBRAL BODY HEIGHT: Normal.  
MARROW SIGNAL: No focal suspect signal abnormality. 
CORD SIGNAL: Normal.  
ADDITIONAL FINDINGS: There is T2 prolongation in the pons. 
-------------------------------------------------------------------------------- 
---------------- 
SEGMENTAL: 
CRANIOCERVICAL JUNCTION: No significant stenosis. 
C2-C3: Right facet hypertrophy. No significant central canal or neural foraminal 
narrowing.   
C3-C4: Mild disc osteophyte complex. No significant central canal or neural 
foraminal narrowing.  
C4-C5: Mild disc osteophyte complex. No significant central canal or neural 
foraminal narrowing.  
C5-C6: Mild disc osteophyte complex. No significant central canal or neural 
foraminal narrowing.  
C6-C7: Mild disc osteophyte complex. No significant central canal or neural 
foraminal narrowing.  
C7-T1: No significant central canal or neural foraminal narrowing. 
-------------------------------------------------------------------------------- 
---------------
IMPRESSION: 1.  Discogenic/degenerative changes as above. 
2.  No cord deformity, cord signal abnormality, significant central canal or 
neural foraminal narrowing. 
3.  Nonspecific T2 prolongation in the pons which could be from microangiopathic 
change. Underlying lesion is not excluded and further evaluation with MRI brain 
without and with contrast is recommended.

## 2022-12-16 IMAGING — MR MRI LUMBAR SPINE WITHOUT CONTRAST
6 of 8 series · 13 of 48 positions shown · IV contrast (gadolinium)
Comparison: None

________________________________________________________________________________________________ 
MRI LUMBAR SPINE WITHOUT CONTRAST, 12/16/2022 [DATE]: 
CLINICAL INDICATION: Injury 2 years ago. Low back pain.
TECHNIQUE: Multiplanar, multiecho position MR images of the lumbar spine were 
performed without intravenous gadolinium enhancement. Patient was scanned on a 
1.5T magnet

[Series 101: survey · axial · 10.0mm · 1.25mm/px · 1 of 10 slices shown]
[im 1/10]
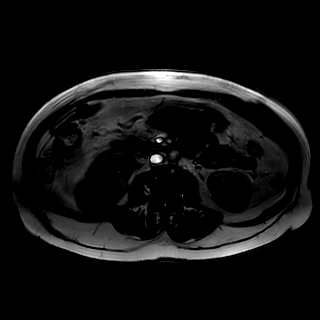

[Series 201: t2w_cor-surv · coronal · 6.0mm · 0.62mm/px · 1 of 10 slices shown]
[im 1/10]
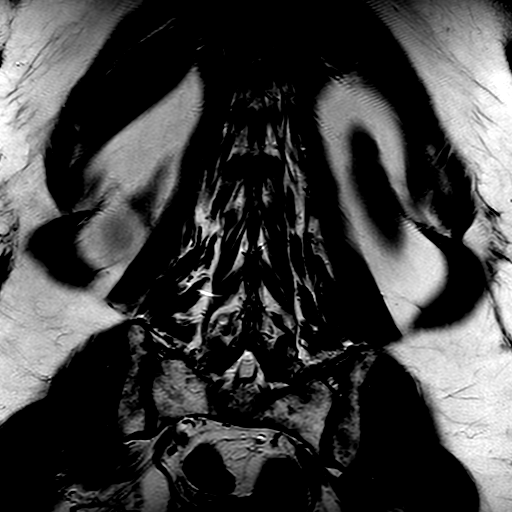

[Series 301: t1_tse_sag · sagittal · 4.0mm · 0.43mm/px · 3 of 19 slices shown]
[im 1/19]
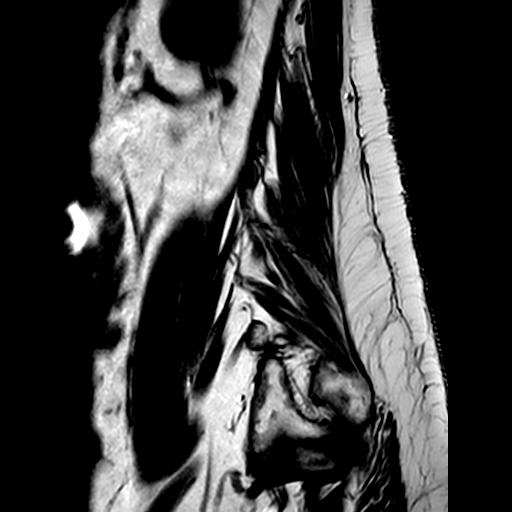
[im 10/19]
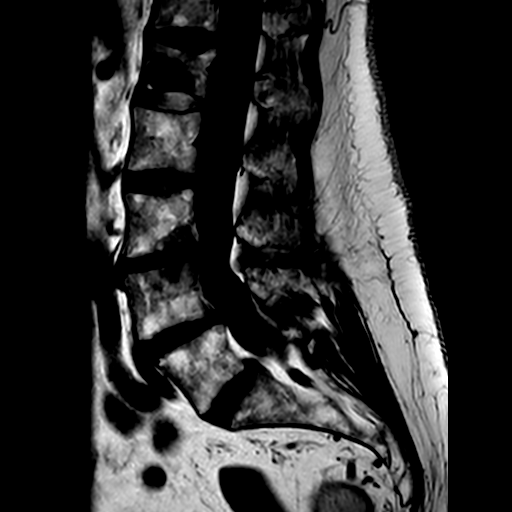
[im 19/19]
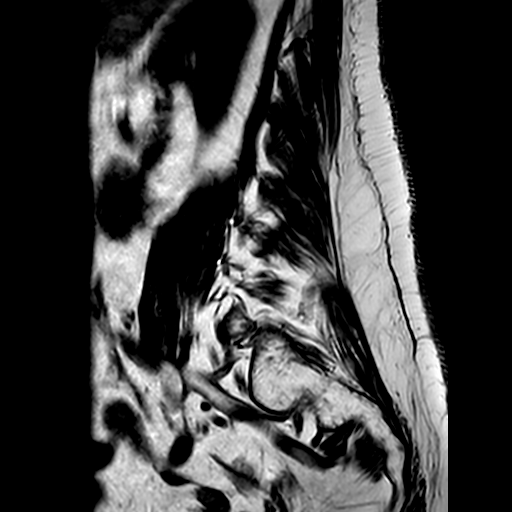

[Series 402: (id)_mdixon_tse · sagittal · 4.0mm · 0.51mm/px · 3 of 19 slices shown]
[im 1/19]
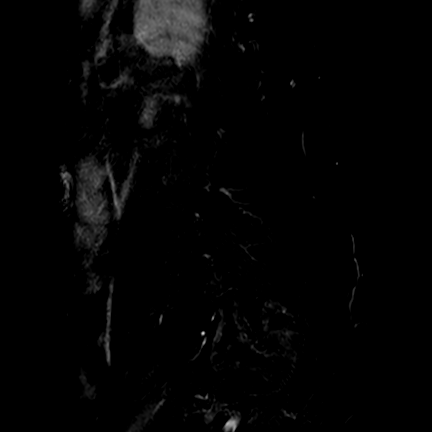
[im 10/19]
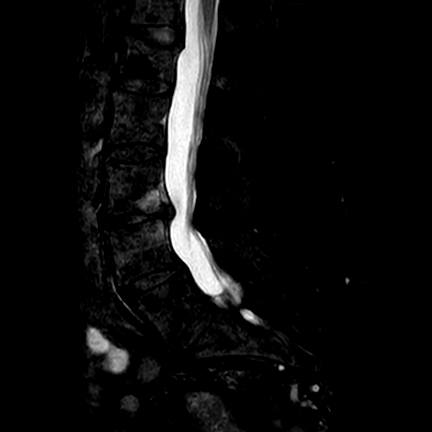
[im 19/19]
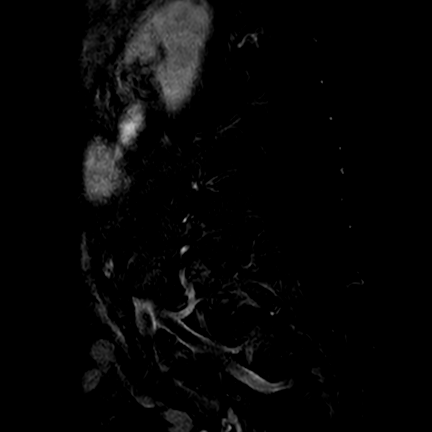

[Series 403: st2w_mdixon_tse · sagittal · 4.0mm · 0.51mm/px · 3 of 19 slices shown]
[im 1/19]
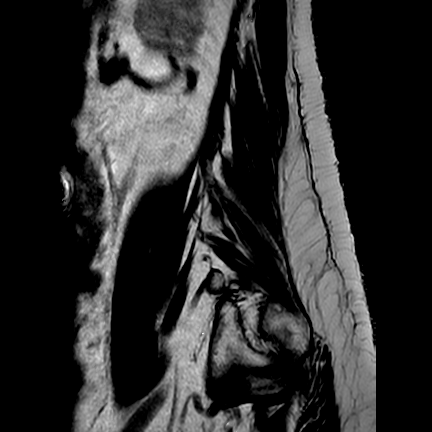
[im 10/19]
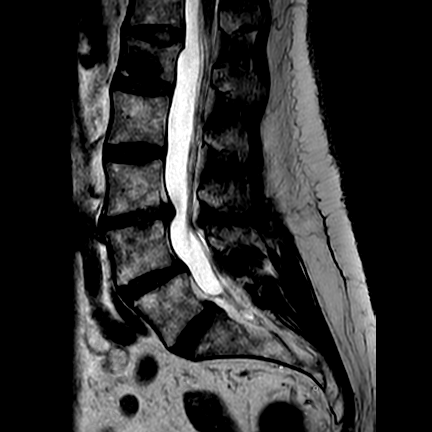
[im 19/19]
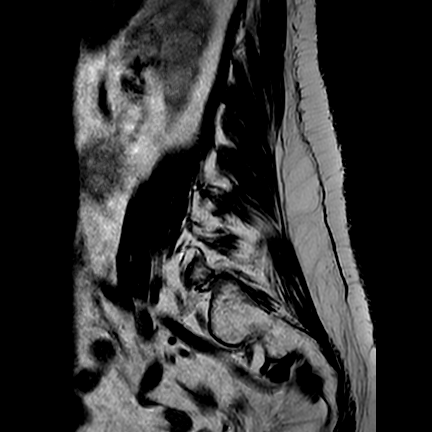

[Series 502: (id) view_ax mpr · axial · 1.0mm · 0.25mm/px · z∈[-126,-91]mm · 2 of 124 slices shown]
[im 8/124]
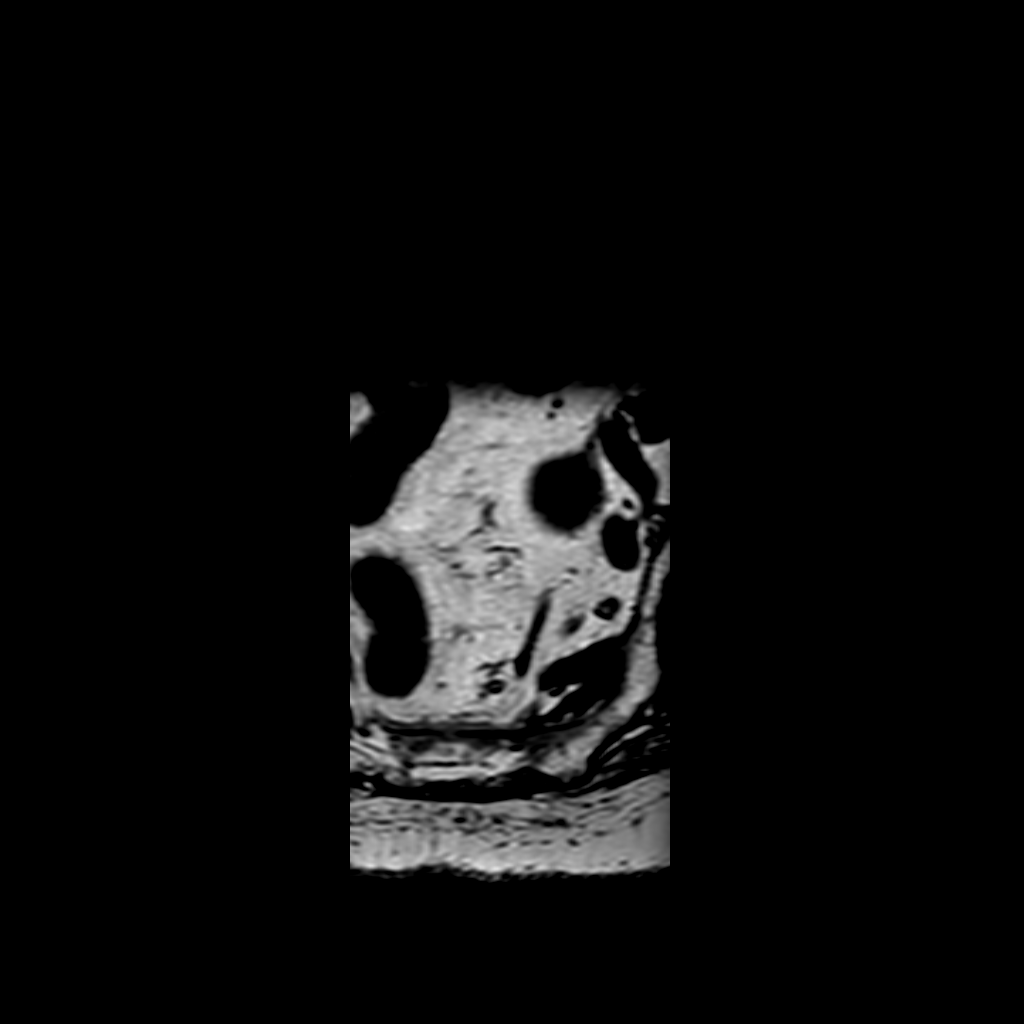
[im 24/124]
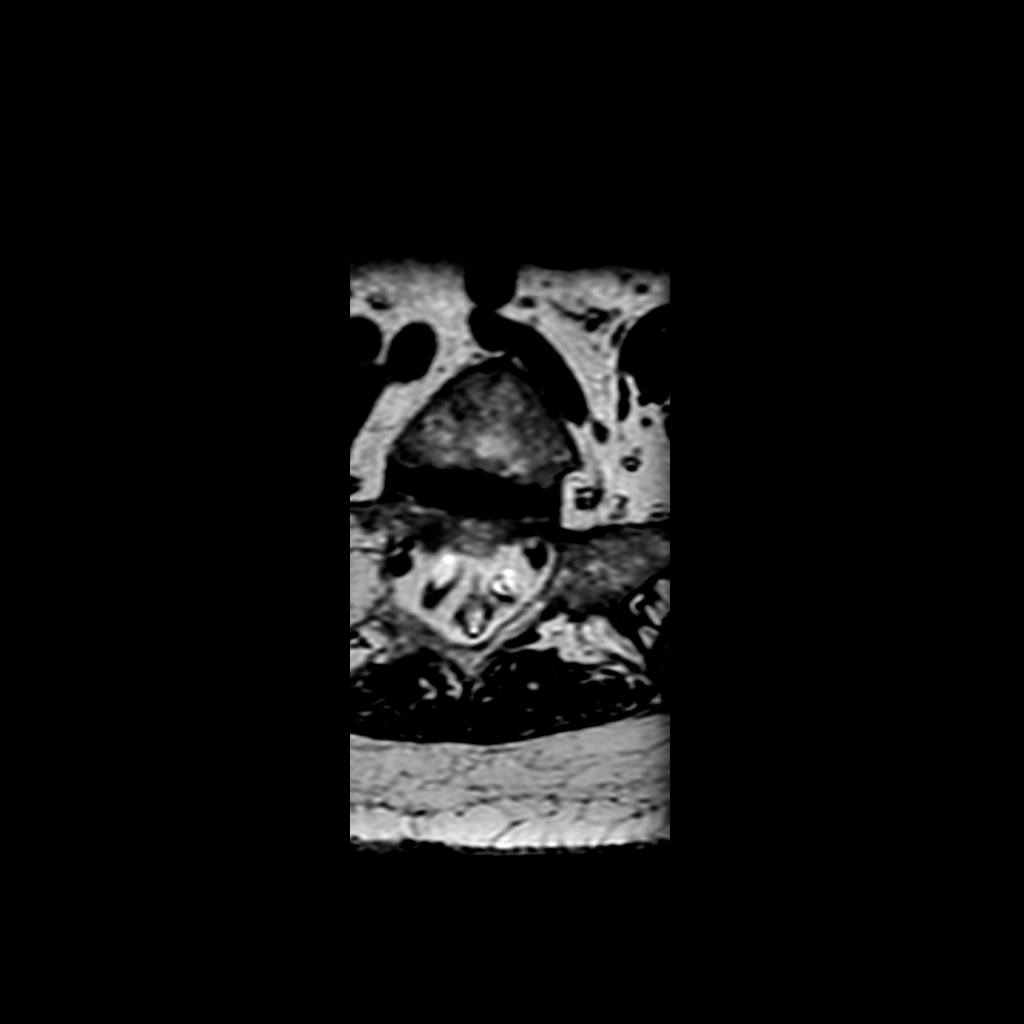

[13 of 48 positions shown; findings below may reference images not displayed]

FINDINGS: -------------------------------------------------------------------------------- 
------ 
GENERAL: 
Nomenclature is based on 5 lumbar type vertebral bodies.     
ALIGNMENT: Minimal levoconvex lower lumbar scoliosis. There is a degree of 
pelvic tilt with the right iliac crest projecting slightly superior to the left 
iliac crest. Grade 1 anterolisthesis L4 on L5 and L5 on S1. Bilateral L5 pars 
defects consistent with L5 spondylolysis. 
VERTEBRAL BODY HEIGHT: Chronic moderate L1 compression deformity previously 
treated by vertebroplasty. 2 mm osseous retropulsion.  
MARROW SIGNAL: No focal suspect signal abnormality. 
CORD SIGNAL: Normal distal spinal cord and cauda equina. Conus medullaris 
terminates at superior L1. 
ADDITIONAL FINDINGS: Right renal cysts. 
Modic I-II: L3-L4, L4-L5 
Ligamentum Flavum > 2.5 mm: All levels. 
-------------------------------------------------------------------------------- 
------ 
SEGMENTAL: 
T12-L1: Loss of disc signal. Minimal osseous retropulsion. Canal and foramina 
are patent. 
L1-L2: Loss of disc signal. Small right facet joint effusion. Otherwise normal. 
L2-L3: Loss of disc signal. Left foraminal annular fissure. Canal and foramina 
are patent. Small bilateral facet joint effusions. 
L3-L4: Moderate to severe loss of disc height specifically to the right. Loss of 
disc signal. Annular bulge with a component of central disc extrusion with disc 
material extending superiorly. This minimally indents the thecal sac with mild 
canal stenosis. Slight right lateral recess narrowing noted. Facet arthropathy 
with small bilateral facet joint effusions. Foramina are patent. 10 mm right 
sided facet synovial cyst projects in the posterior soft tissues. 
L4-L5: Moderate loss of disc height posteriorly. Loss of disc signal. Disc 
uncovering. Canal patent. Small bilateral facet joint effusions. Foramina 
patent. 
L5-S1: Loss of disc signal. Anterolisthesis with disc uncovering. Advanced facet 
arthropathy. Canal and foramina are patent. 
-------------------------------------------------------------------------------- 
------
IMPRESSION: Degenerative and scoliotic changes. Mild malalignment with bilateral L5 pars 
defects consistent with L5 spondylolysis. 
Chronic moderate L1 compression deformity with vertebroplasty change. 
No critical or significant central canal narrowing with variable lateral recess 
and foraminal narrowing detailed above. 
Exam was reviewed and read in conjunction with Dr. Kebich.

## 2023-03-13 IMAGING — MR MRI BRAIN W/WO CONTRAST
2 of 15 series · 5 of 48 positions shown · IV contrast (gadavist)
Comparison: MRI cervical spine from December 16, 2022.

________________________________________________________________________________________________ 
MRI BRAIN W/WO CONTRAST, 03/13/2023 [DATE]: 
CLINICAL INDICATION: Nonspecific abnormal signal seen in the pons on prior 
cervical spine MRI.
TECHNIQUE: Multiplanar, multiecho position MR images of the brain were performed 
without and with 7 mL of Gadavist were injected intravenously by hand. 0.5 mL of 
Gadavist discarded. Patient was scanned on a 1.5T magnet.

[Series 1002: T1 post-contrast · coronal · 1.0mm · 0.24mm/px · 3 of 180 slices shown (1 of 2)]
[im 23/180]
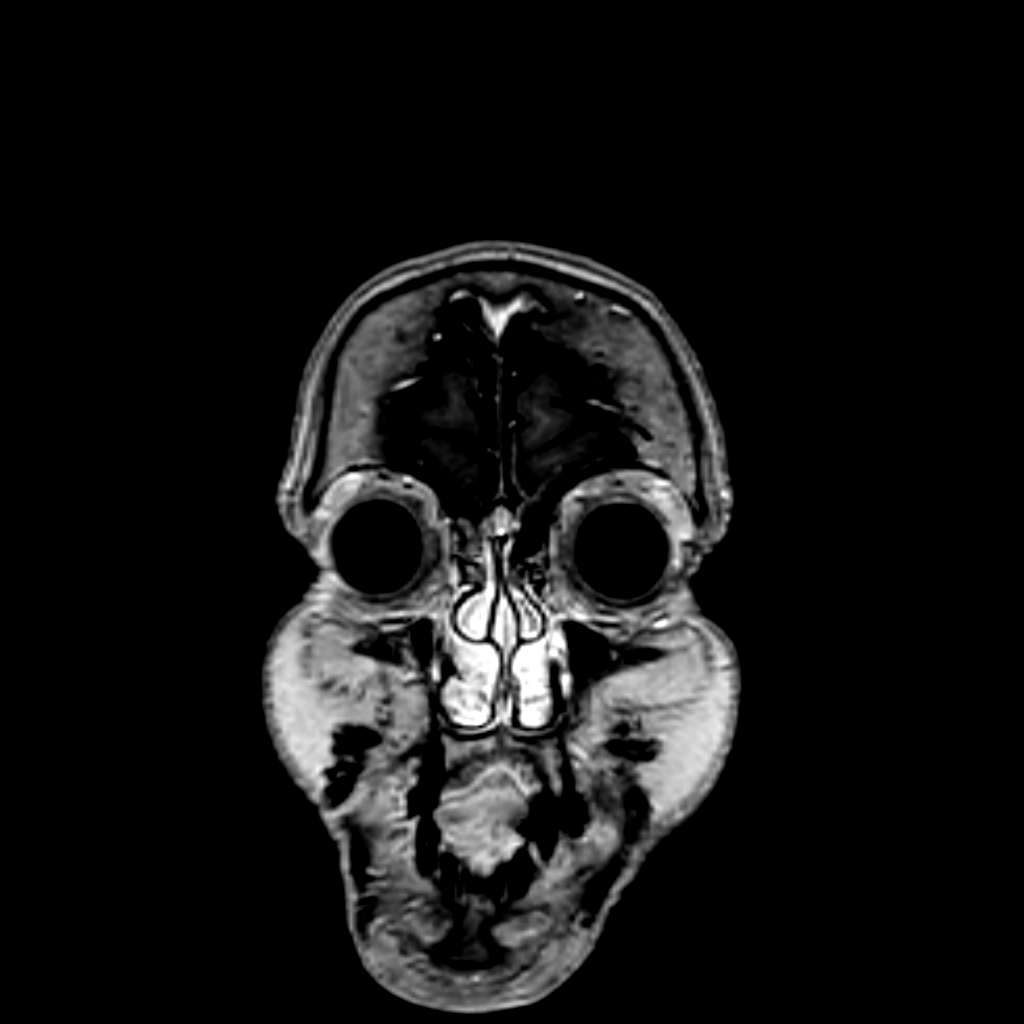
[im 90/180]
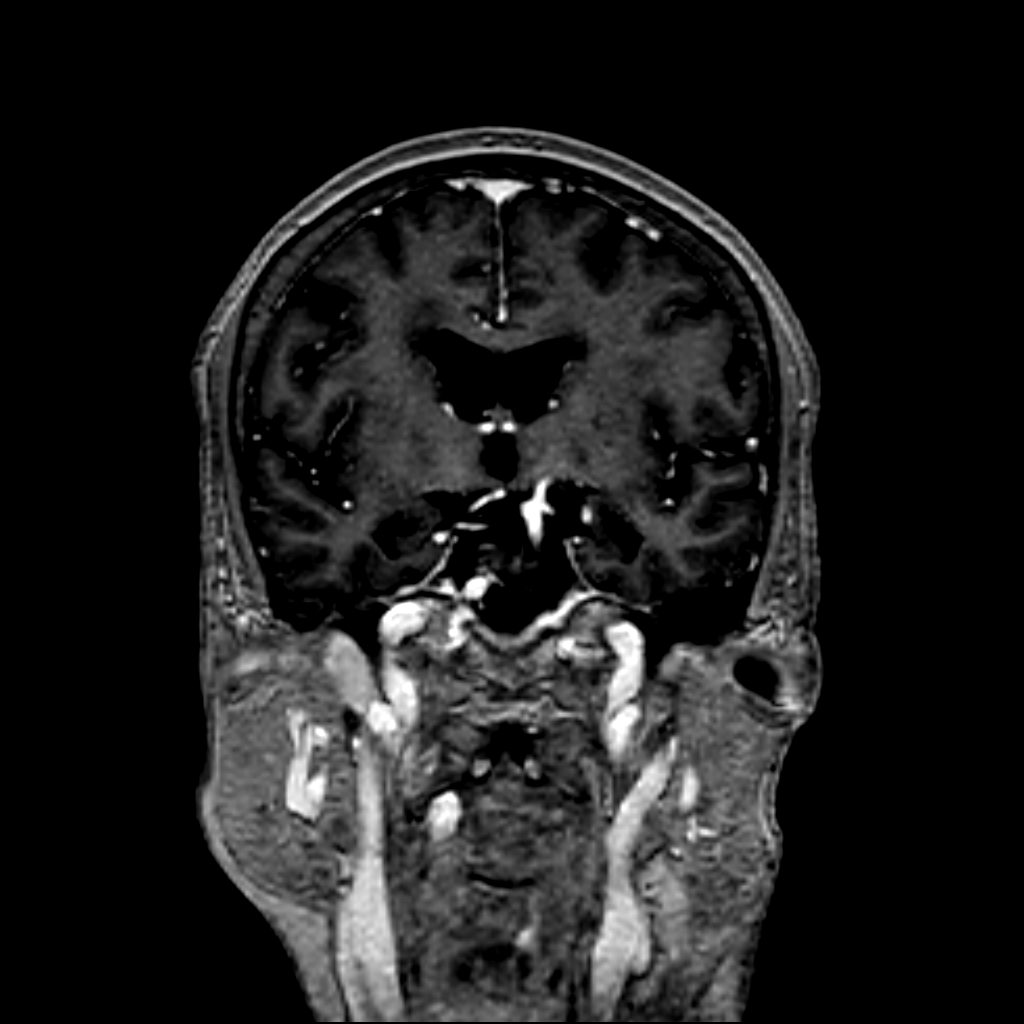
[im 157/180]
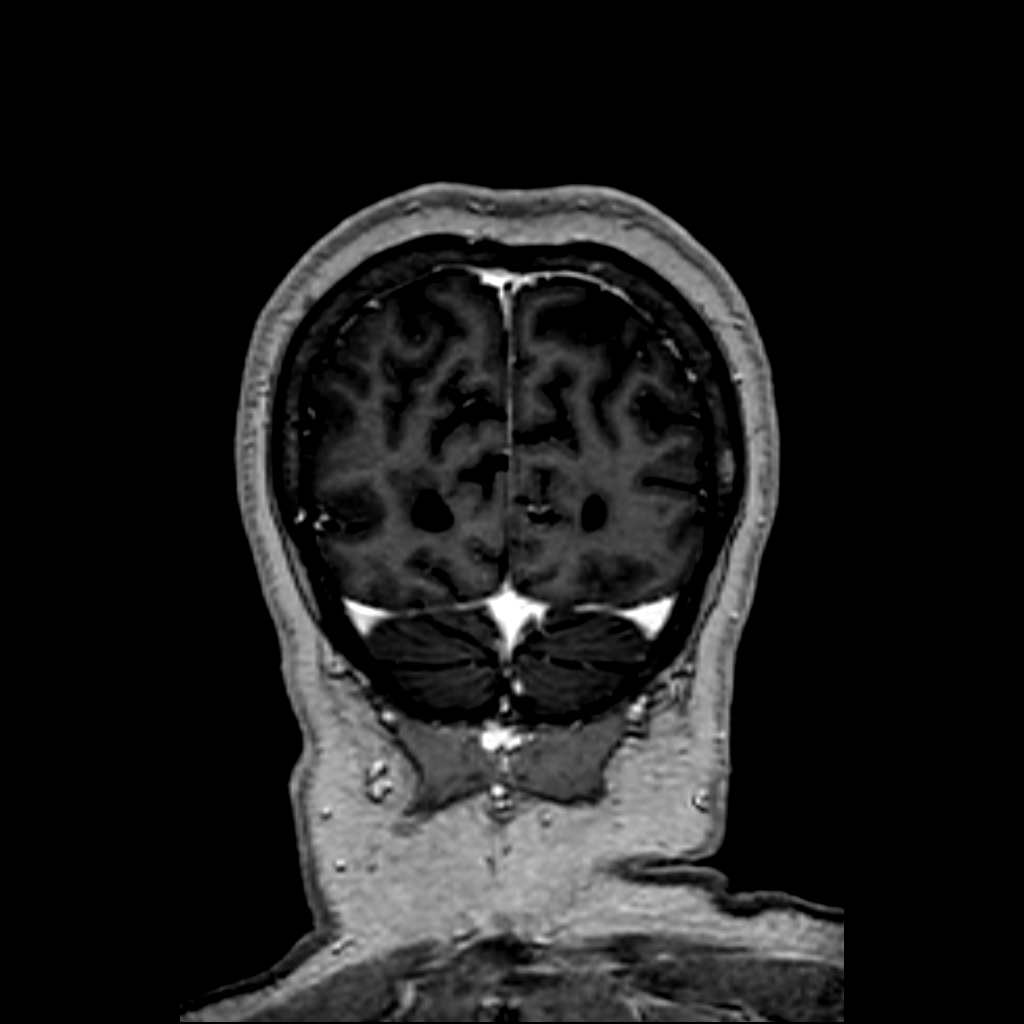

[Series 1003: T1 post-contrast · axial · 1.0mm · 0.24mm/px · z∈[-74,-11]mm · 2 of 170 slices shown (2 of 2)]
[im 22/170]
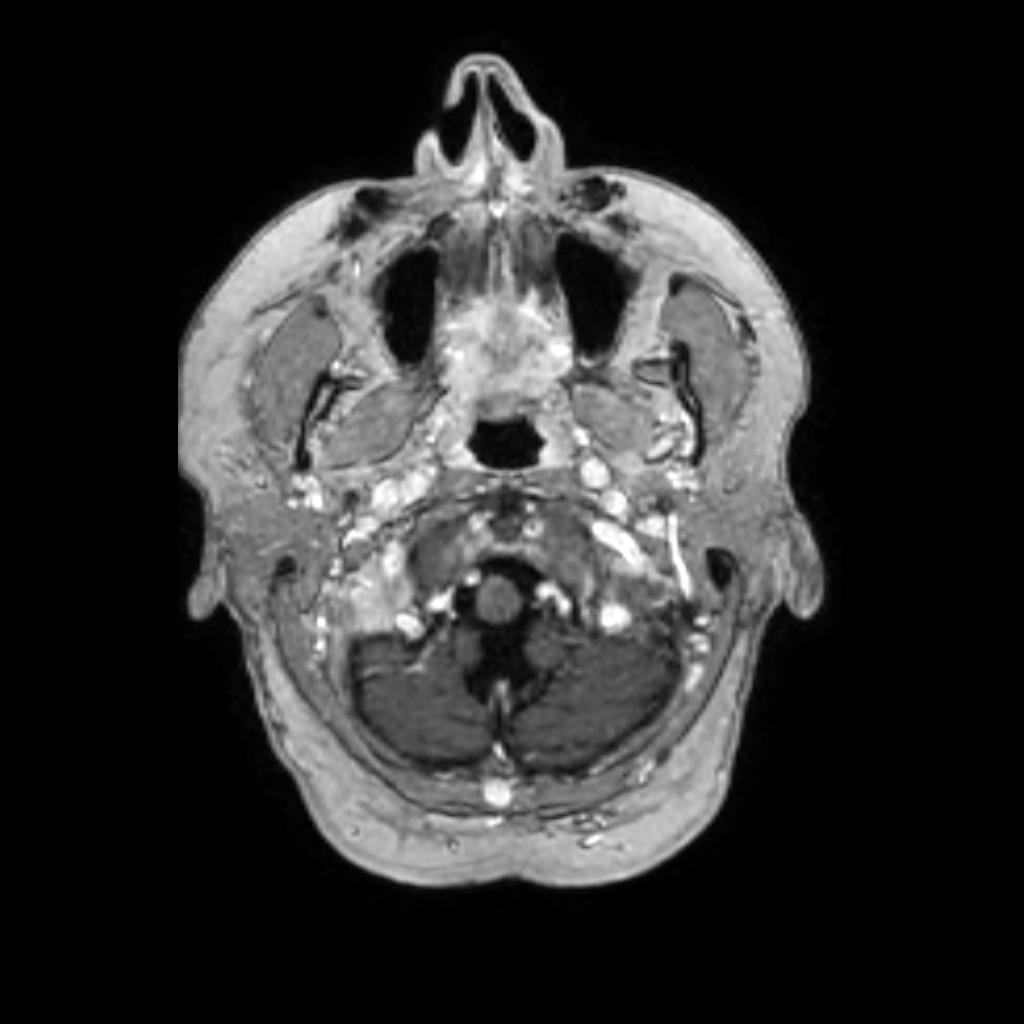
[im 85/170]
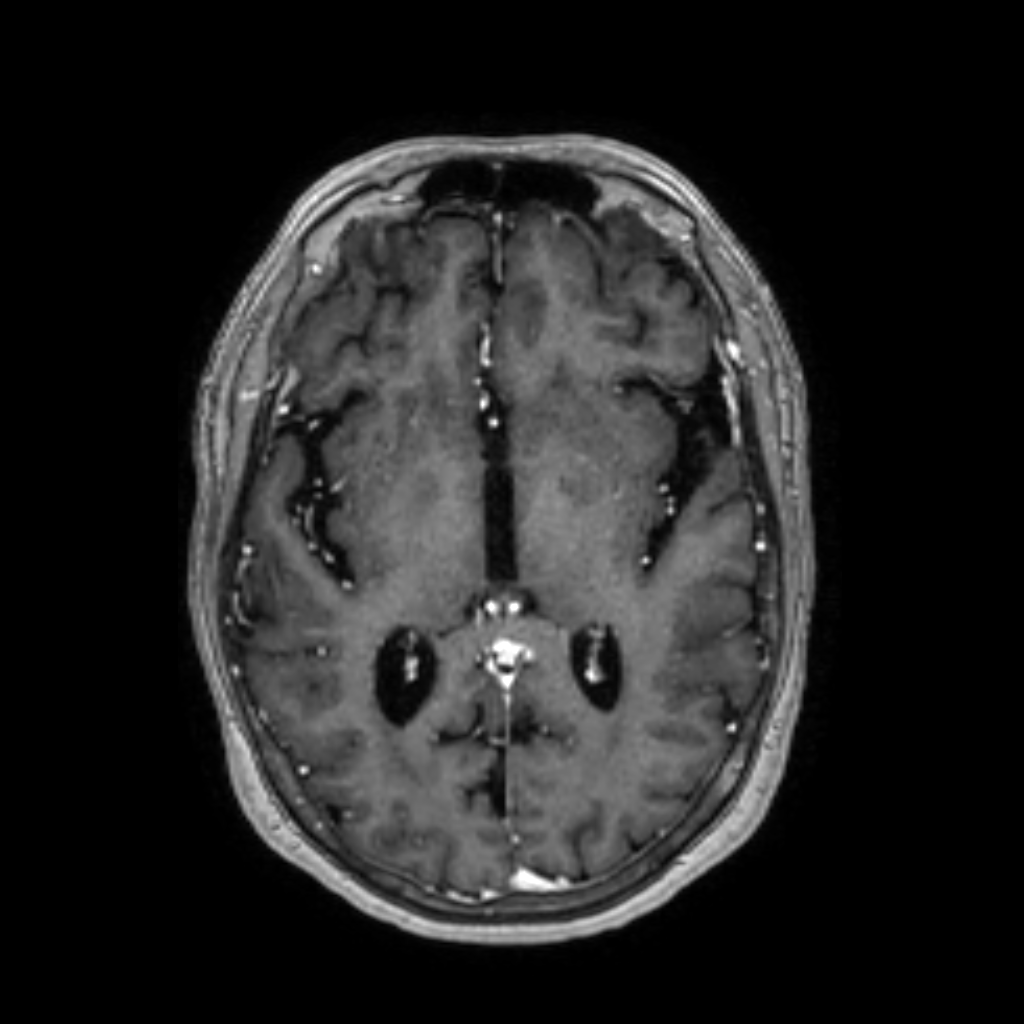

[5 of 48 positions shown; findings below may reference images not displayed]

FINDINGS: -------------------------------------------------------------------------------- 
------------------------- 
INTRACRANIAL: 
No acute ischemia. No abnormal foci of susceptibility artifact in the brain. 
Patency of intracranial vascular flow voids. Periventricular and deep white 
matter change, probably secondary to microangiopathy. This is extensive and is 
present within the pons. No acute hemorrhage, midline shift, mass effect.   No 
pathologic enhancement in the brain. 
-------------------------------------------------------------------------------- 
----------------------- 
OTHER: 
ORBITS/SINUSES/T-BONES:  Visualized orbits show no acute abnormality or mass.  
Mastoid air cells and middle ear cavities are grossly clear.  Visualized 
paranasal sinuses are clear. Bilateral facial cosmetic filler material. 
MARROW SIGNAL/SOFT TISSUES: No focal suspect signal abnormality.  
-------------------------------------------------------------------------------- 
-------------------
IMPRESSION: No acute intracranial abnormality or pathologic enhancement. Extensive white 
matter microangiopathic changes, with involvement of the pons.

## 2023-04-12 IMAGING — CT CT ABDOMEN W/WO CONTRAST
3 of 8 series · 9 of 46 positions shown, 16 images · IV contrast (APPLIED)
Comparison: None 
Count of known CT and Cardiac Nuclear Medicine studies performed in the previous 
12 months = 0.

________________________________________________________________________________________________ 
CT ABDOMEN W/WO CONTRAST, 04/12/2023 [DATE]: 
CLINICAL INDICATION: Angiomyolipoma right kidney. 
A search for DICOM formatted images was conducted for prior CT imaging studies 
completed at a non-affiliated media free facility.
TECHNIQUE: The abdomen was scanned from lung bases through the aortic 
bifurcation without and with 50 mL of Isovue 300 MDV injected intravenously on a 
high-resolution CT scanner using dose reduction techniques. Routine MPR 
reconstructions were performed. The patients eGFR was calculated to be
mL/min/1.73 m2 using the i-STAT device.

[Series 6: cor art · coronal · 0.49mm/px · 2 of 132 slices shown, 3 images]
[im 44/132  soft-tissue]
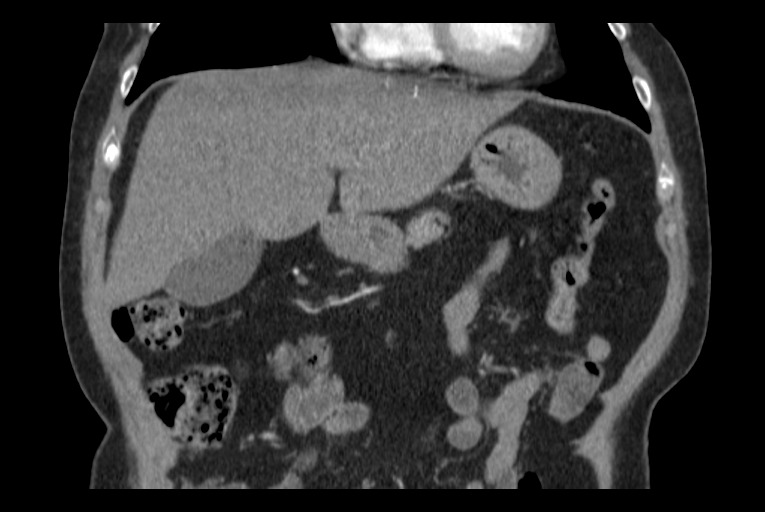
[im 44/132  bone]
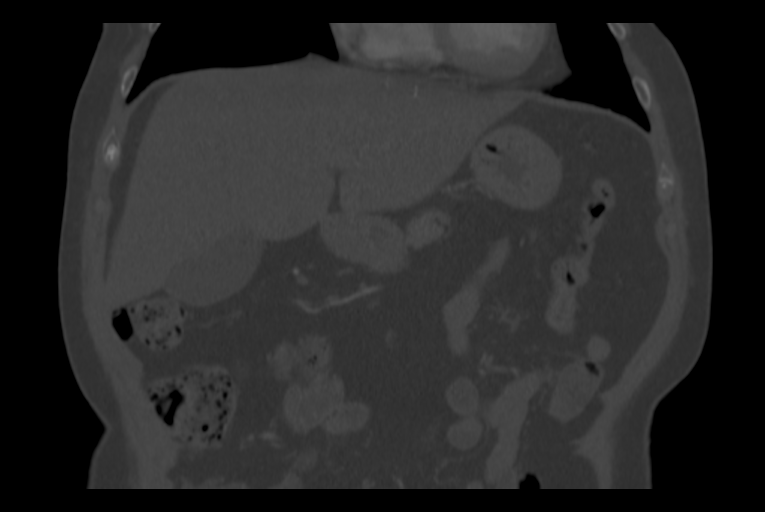
[im 88/132  soft-tissue]
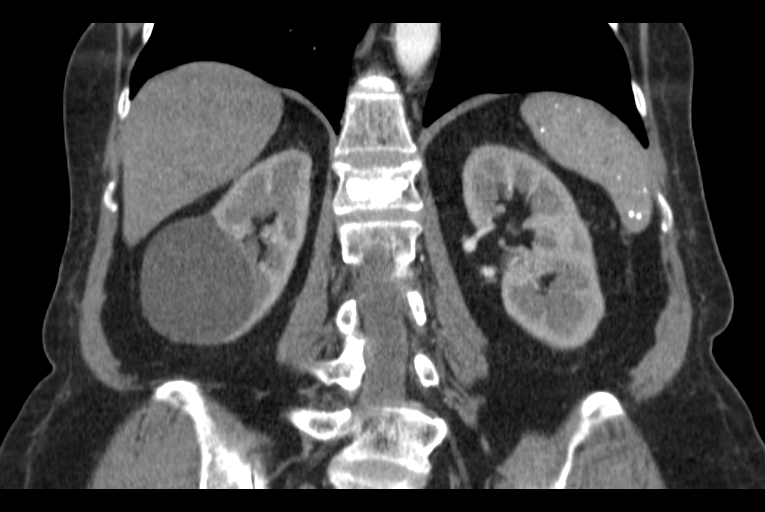

[Series 7: axial portal · axial · portal-venous · 0.73mm/px · z∈[-243,-81]mm · 6 of 76 slices shown, 11 images]
[im 11/76  soft-tissue]
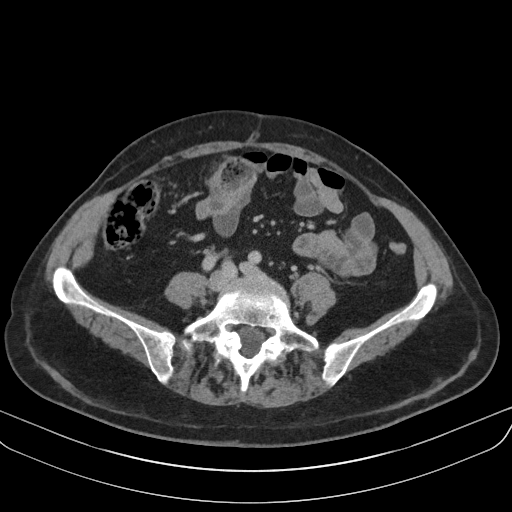
[im 11/76  bone]
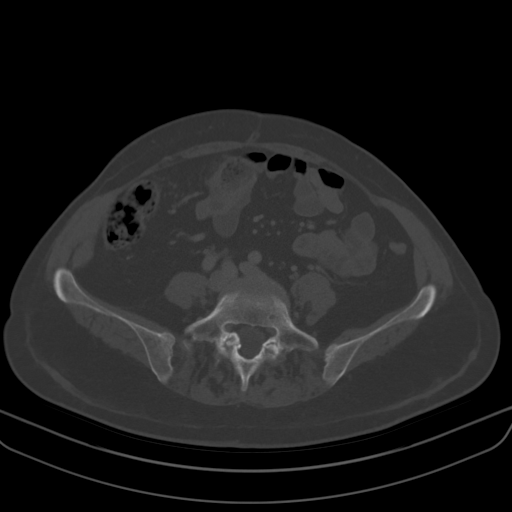
[im 22/76  soft-tissue]
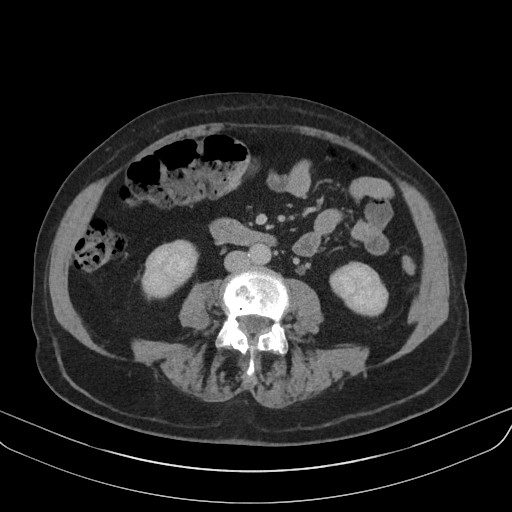
[im 33/76  soft-tissue]
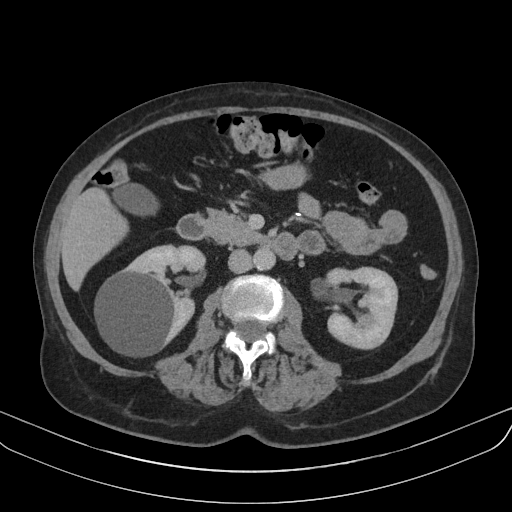
[im 33/76  lung]
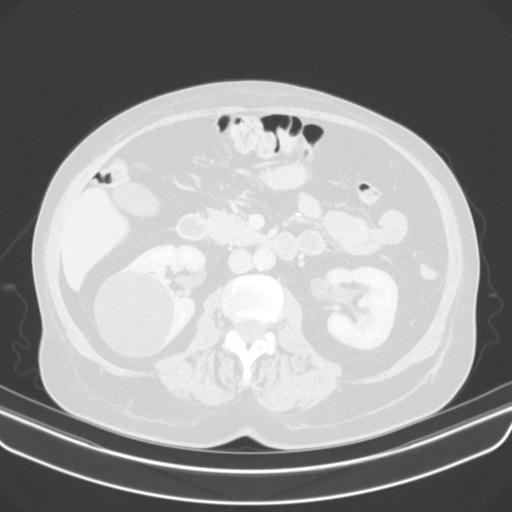
[im 43/76  soft-tissue]
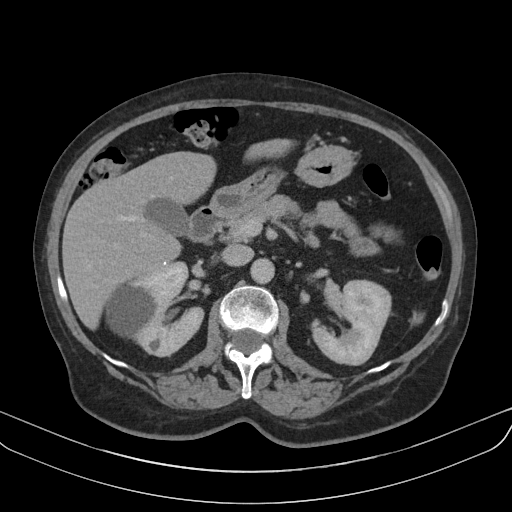
[im 43/76  lung]
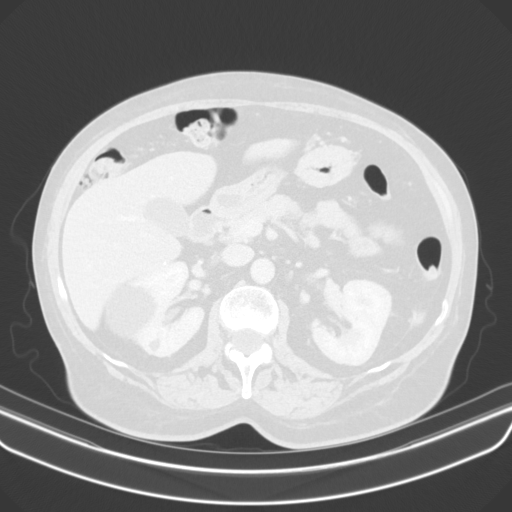
[im 54/76  soft-tissue]
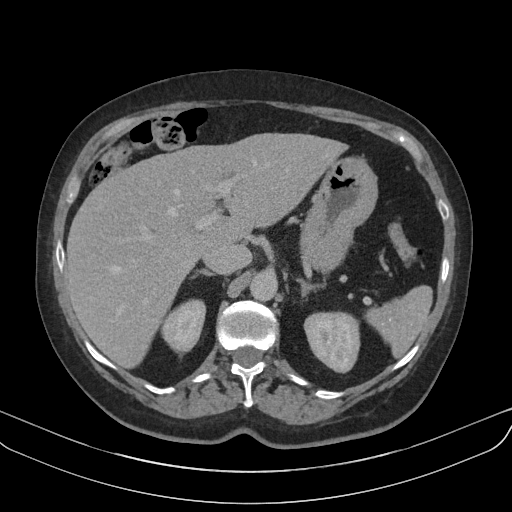
[im 54/76  lung]
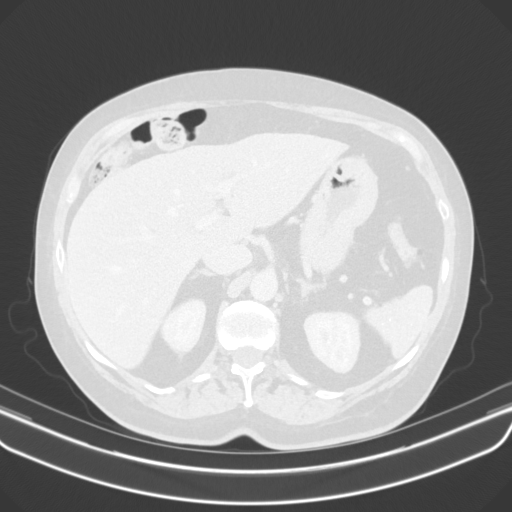
[im 65/76  soft-tissue]
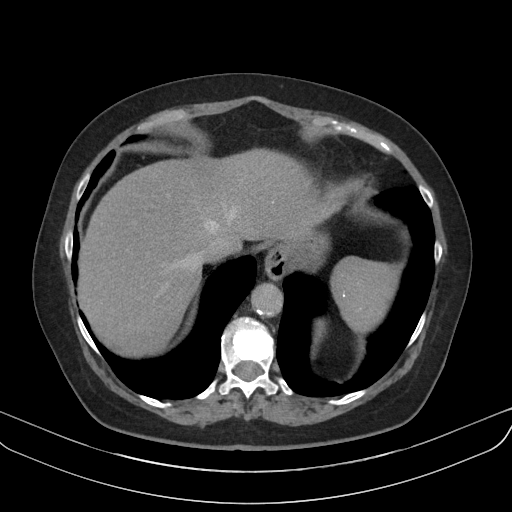
[im 65/76  lung]
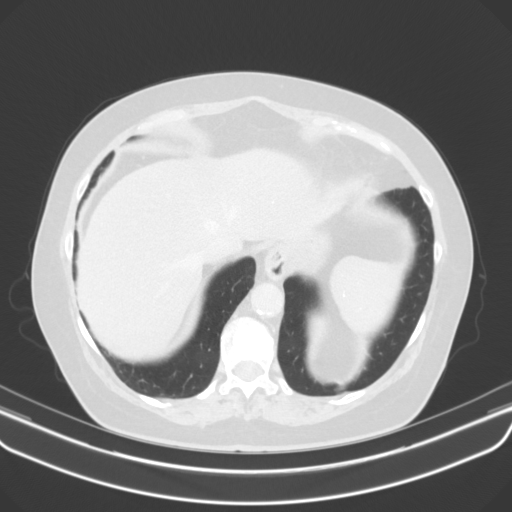

[Series 9: sag portal · sagittal · portal-venous · 0.48mm/px · 1 of 108 slices shown, 2 images]
[im 36/108  soft-tissue]
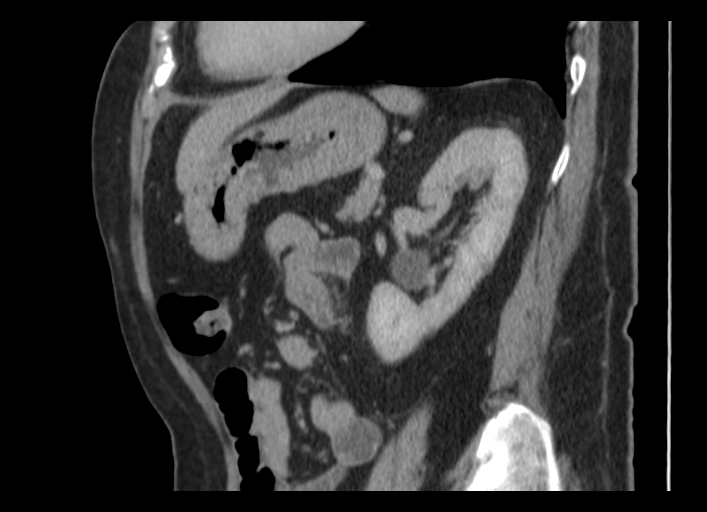
[im 36/108  bone]
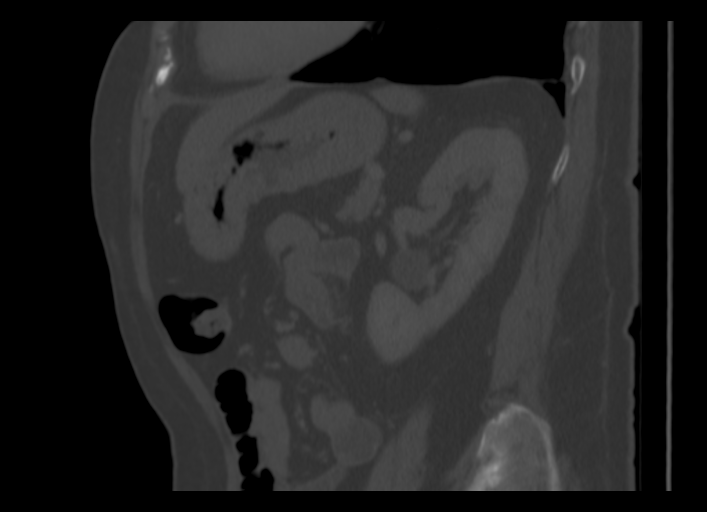

[9 of 46 positions shown; findings below may reference images not displayed]

FINDINGS: LUNG BASES: Calcified lymph node adjacent to the esophagus within the inferior 
posterior mediastinum. No suspicious mass or effusion seen within the lung 
bases. 
HEPATOBILIARY: Calcified granuloma identified. No suspicious mass. No 
gallstones. 
SPLEEN: Multiple calcified granuloma identified. No suspicious mass identified. 
PANCREAS: No ductal dilatation or mass.   
ADRENALS: No mass. 
KIDNEYS: Multiple cortical cysts are identified on the right. The largest at
cm. No suspicious mass identified. No definite angiomyolipoma identified. 
LYMPH NODES: No adenopathy. 
STOMACH, SMALL BOWEL AND COLON: No bowel wall thickening or obstruction. 
VASCULAR STRUCTURES: Mild atherosclerotic changes.  
MUSCULOSKELETAL: Degenerative changes. Changes of previous vertebroplasty x2  
ADDITIONAL FINDINGS: None.
IMPRESSION: Multiple cortical cysts overlying the right kidney without suspicious mass 
identified. The prior ultrasound findings may have represented volume averaging 
with the peripelvic fat. 
Atherosclerotic changes, degenerative changes and changes of old granulomatous 
disease. 
RADIATION DOSE REDUCTION: All CT scans are performed using radiation dose 
reduction techniques, when applicable.  Technical factors are evaluated and 
adjusted to ensure appropriate moderation of exposure.  Automated dose 
management technology is applied to adjust the radiation doses to minimize 
exposure while achieving diagnostic quality images.

## 2023-07-12 IMAGING — DX CHEST PA AND LATERAL
2 series · 2 of 2 positions shown · non-contrast
Comparison: None.

________________________________________________________________________________________________ 
CLINICAL INDICATION: Cough, Unspecified.

[PA]
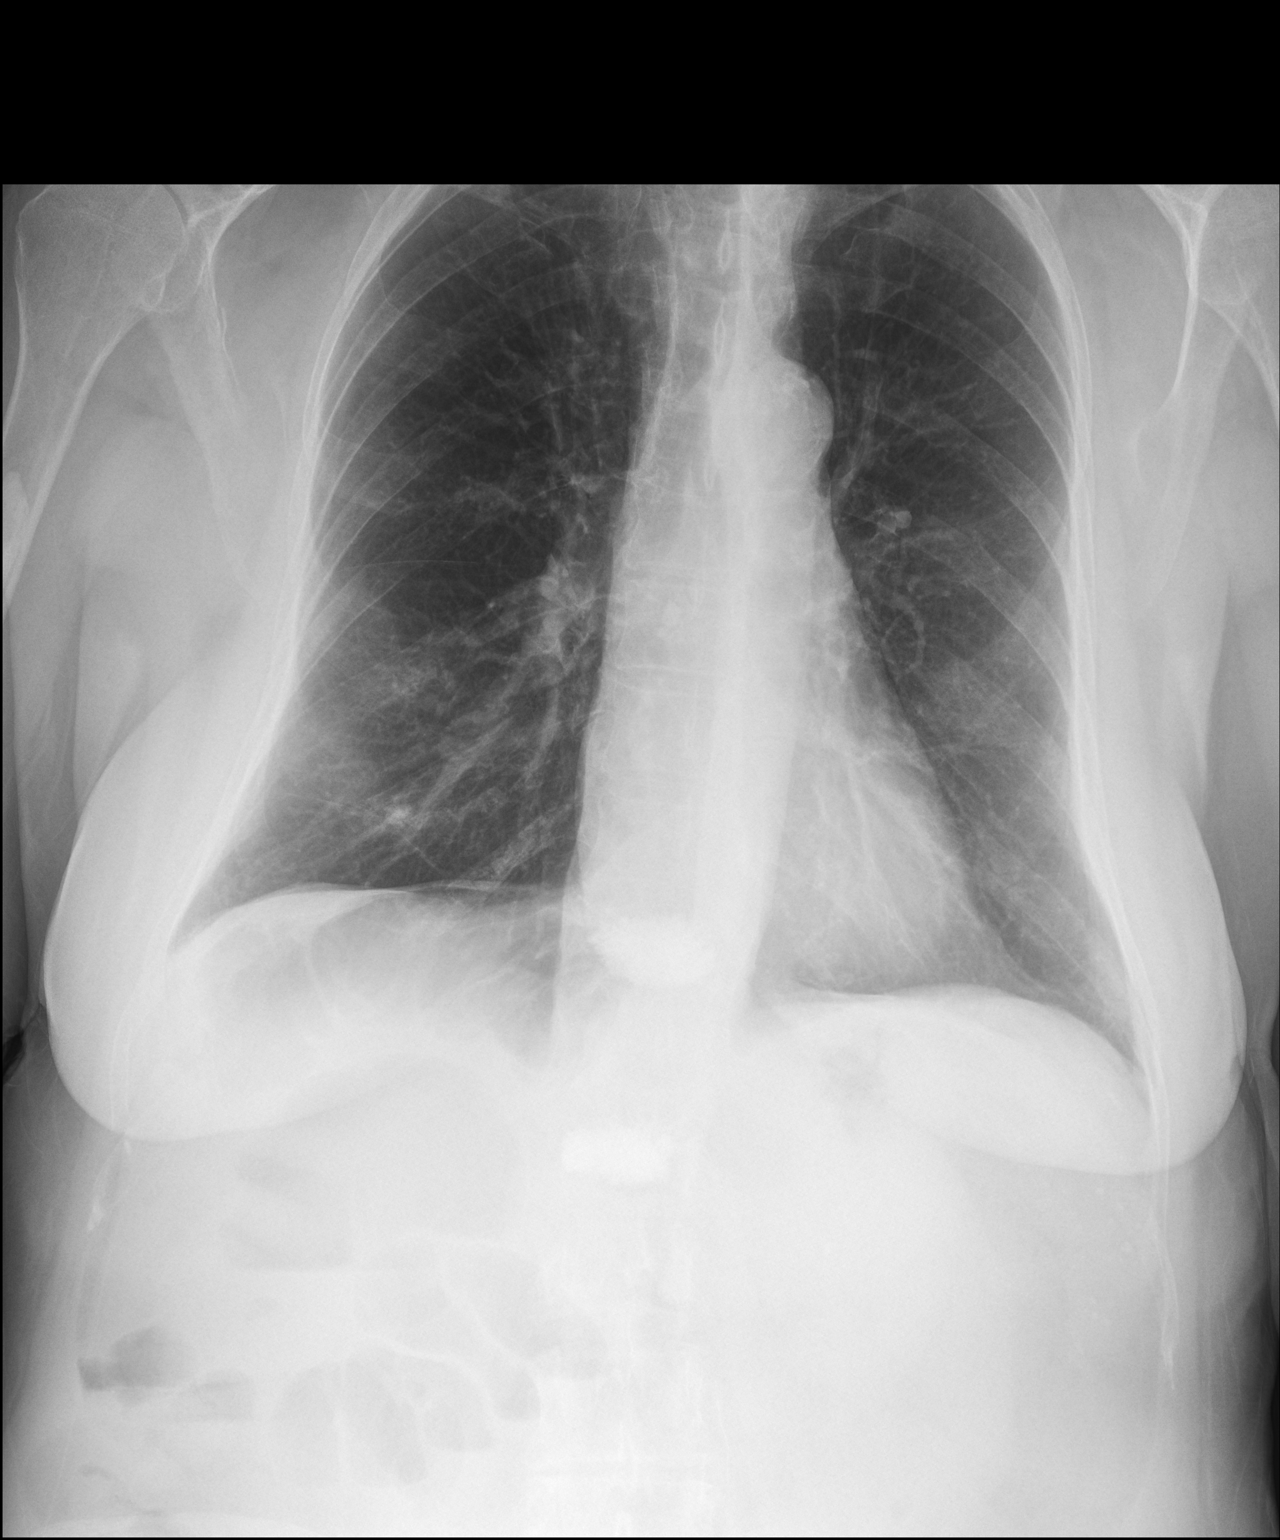

[lateral]
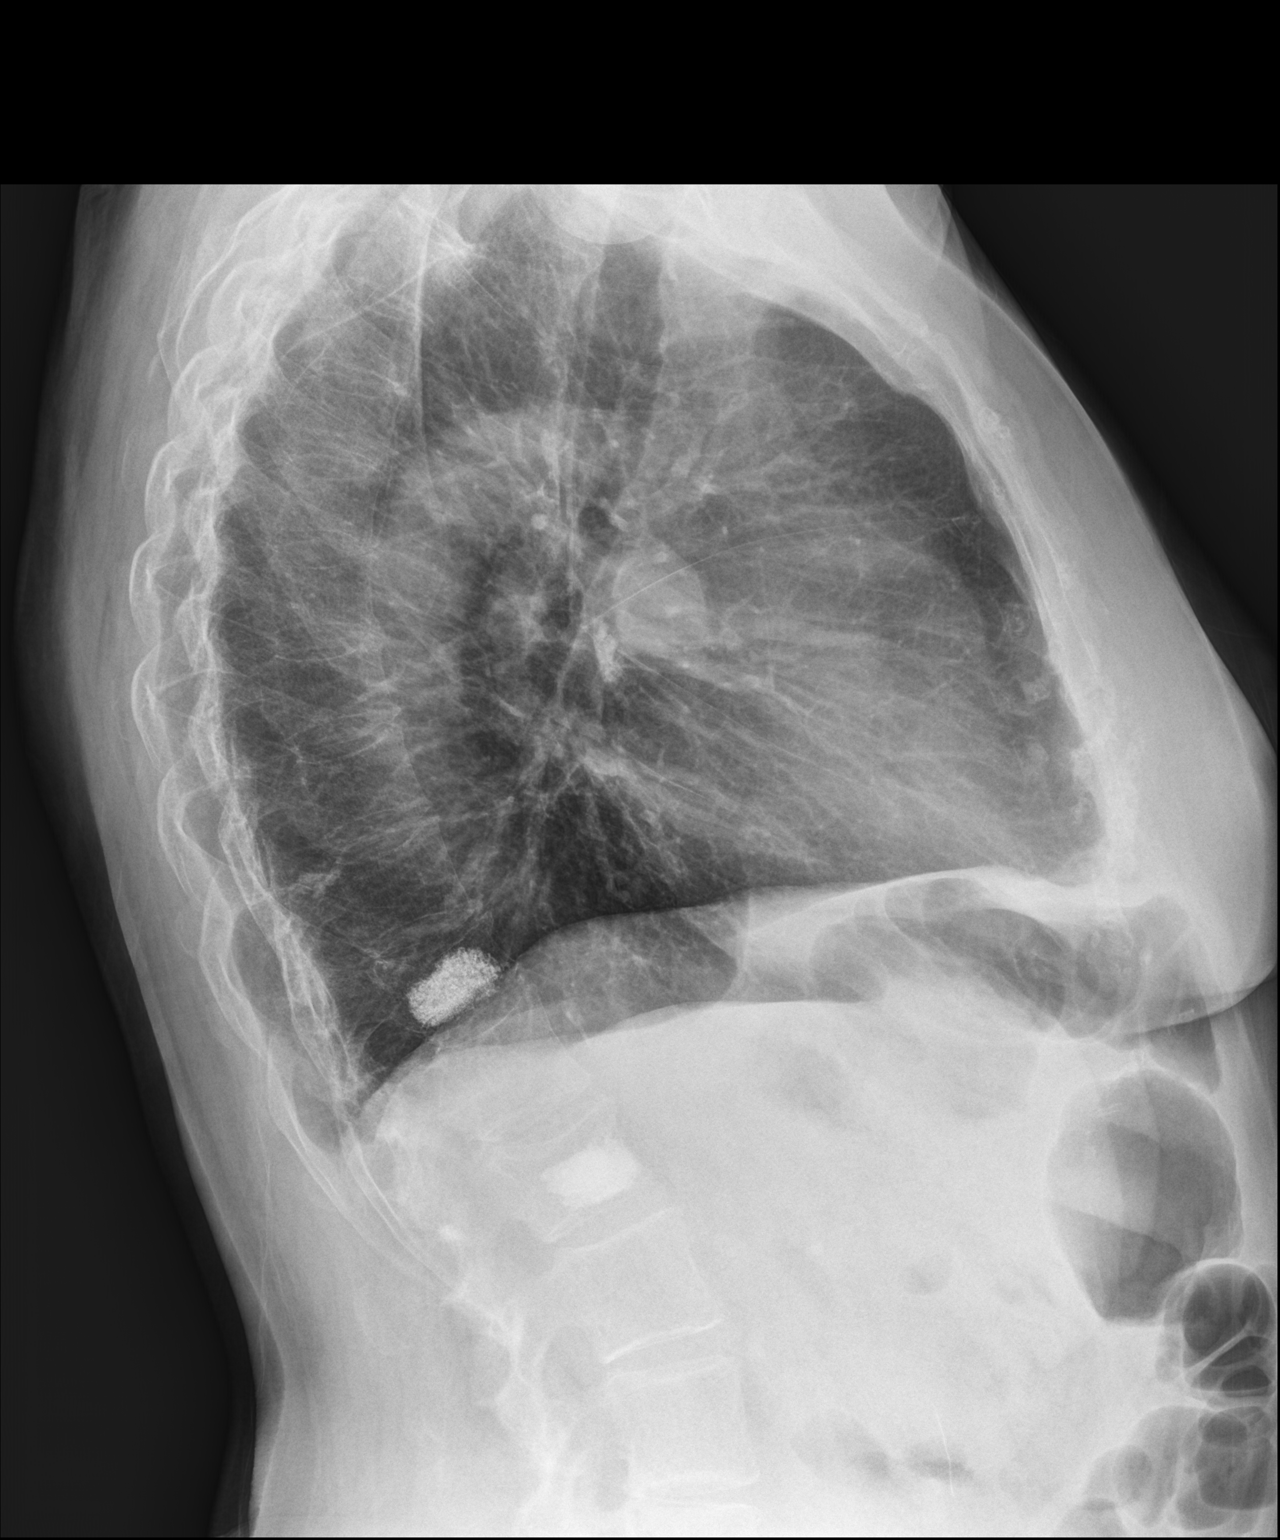

[2 of 2 positions shown; findings below may reference images not displayed]

FINDINGS: Lungs are clear. No consolidation. No effusion. Normal cardiac size 
and pulmonary vascularity. No pneumothorax. No acute osseous abnormality. 
Osteopenia of the thoracic spine with vertebroplasties at the thoracolumbar 
junction.
IMPRESSION: No acute cardiopulmonary findings.

## 2023-07-29 IMAGING — CT CT ABDOMEN W/WO CONTRAST
3 of 8 series · 9 of 46 positions shown, 16 images · IV contrast (APPLIED)
Comparison: CT abdomen April 12, 2023, MRI lumbar spine December 16, 2022.

________________________________________________________________________________________________ 
CT ABDOMEN W/WO CONTRAST, 07/29/2023 [DATE]: 
CLINICAL INDICATION: Benign Lipomatous Neoplasm Of Kidney , follow-up kidney 
lesions. Incontinence.  
A search for DICOM formatted images was conducted for prior CT imaging studies 
completed at a non-affiliated media free facility.
TECHNIQUE: The abdomen was scanned from lung bases through the aortic 
bifurcation without and with 50 mL of Isovue 300 MDV injected intravenously on a 
high-resolution CT scanner using dose reduction techniques. Routine MPR 
reconstructions were performed.

[Series 6: cor art · coronal · 0.43mm/px · 2 of 129 slices shown, 3 images]
[im 43/129  soft-tissue]
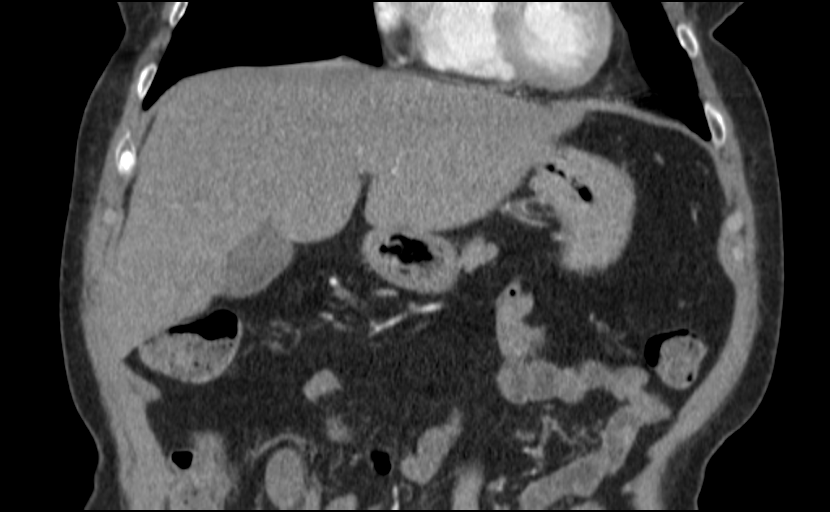
[im 43/129  bone]
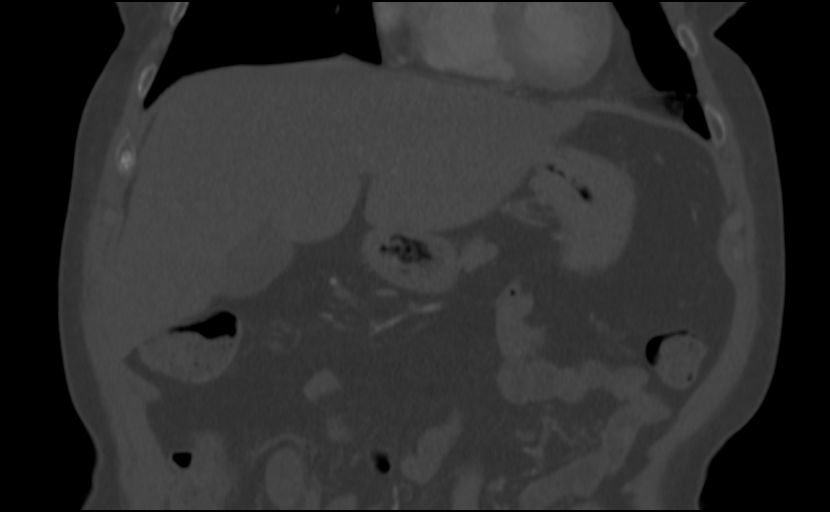
[im 86/129  soft-tissue]
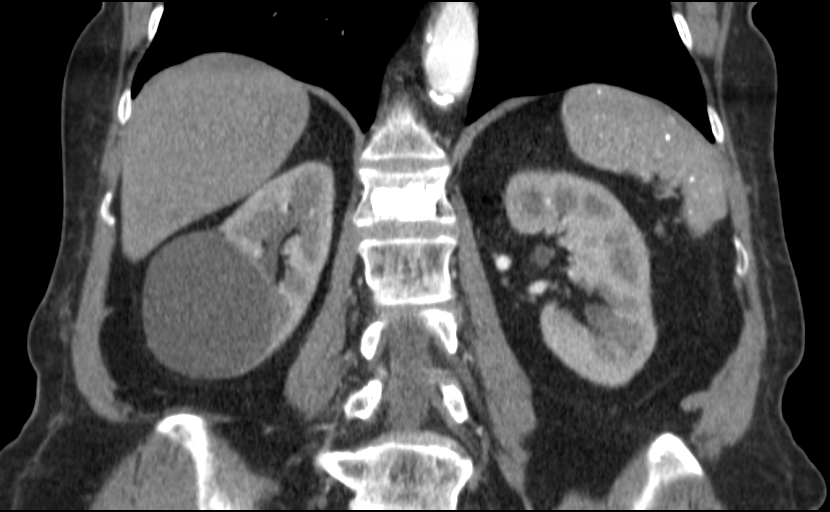

[Series 7: axial portal · axial · portal-venous · 0.73mm/px · z∈[-220,-64]mm · 6 of 74 slices shown, 11 images]
[im 11/74  soft-tissue]
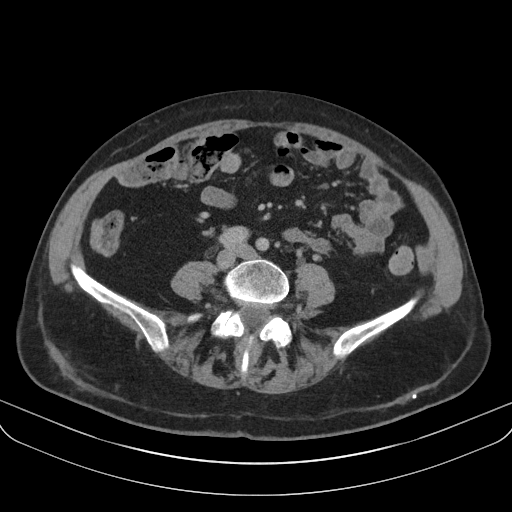
[im 11/74  bone]
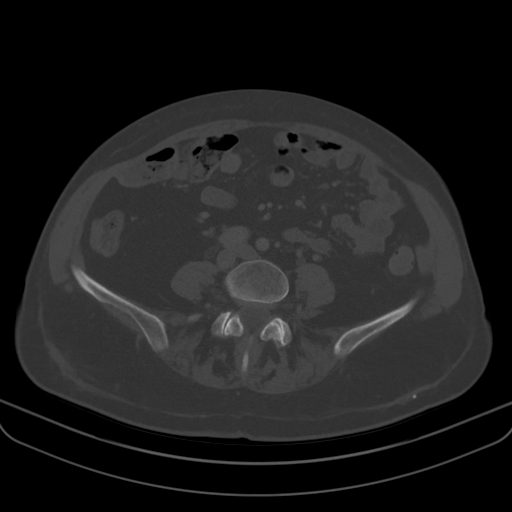
[im 21/74  soft-tissue]
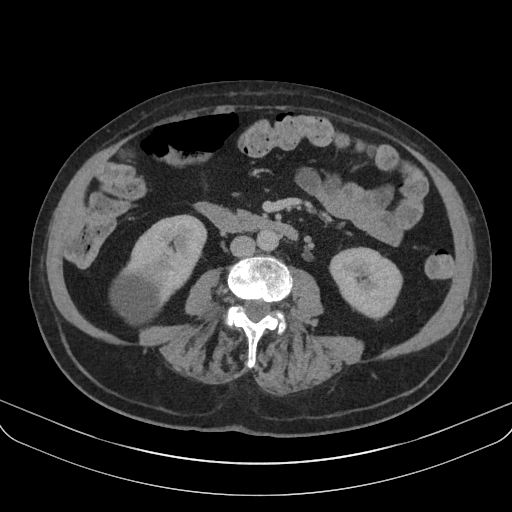
[im 32/74  soft-tissue]
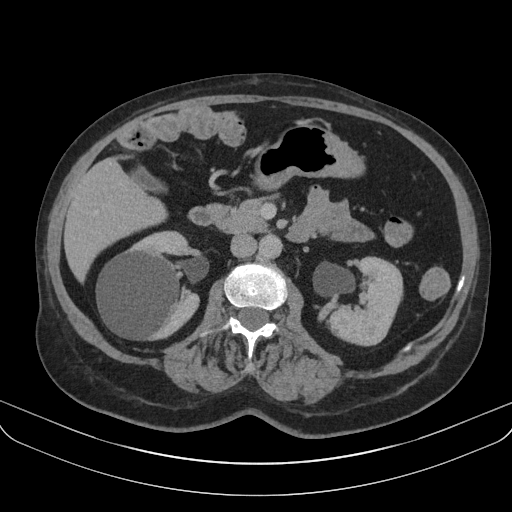
[im 32/74  lung]
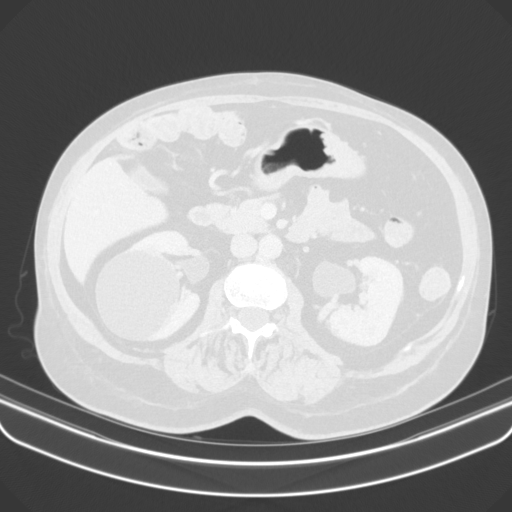
[im 42/74  soft-tissue]
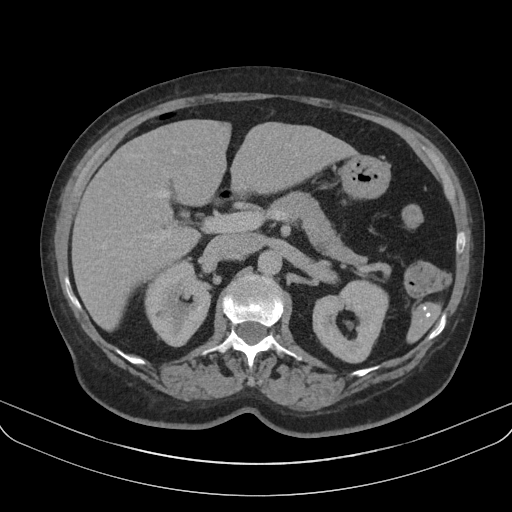
[im 42/74  lung]
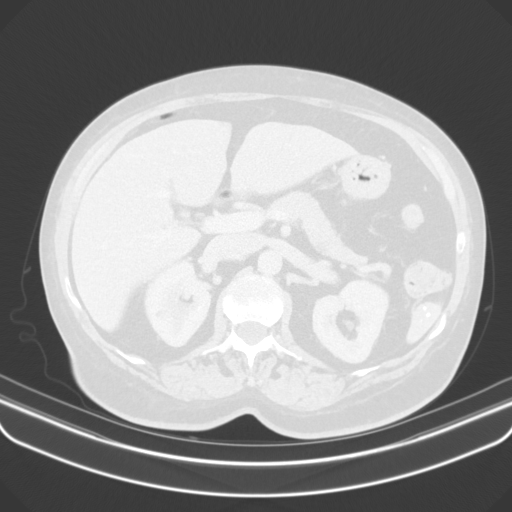
[im 53/74  soft-tissue]
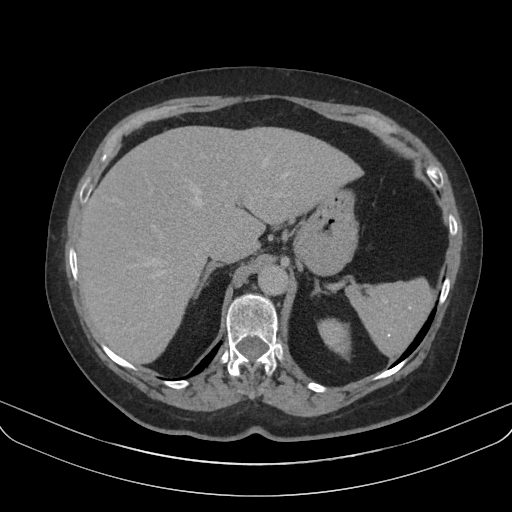
[im 53/74  lung]
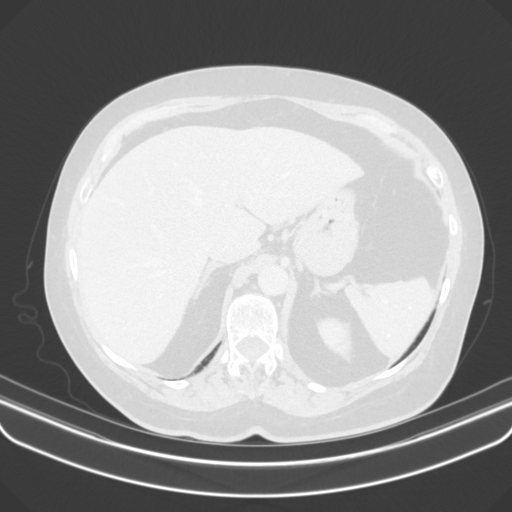
[im 63/74  soft-tissue]
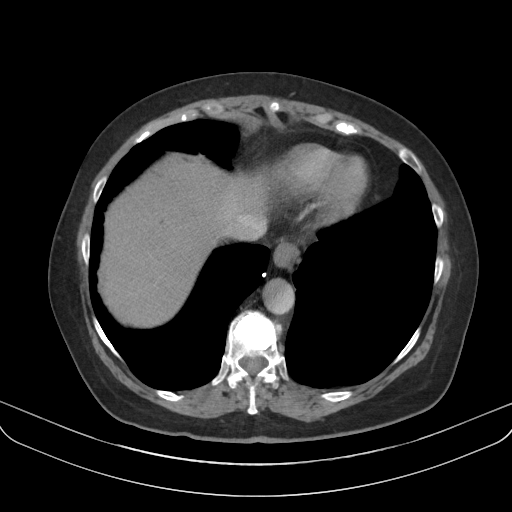
[im 63/74  lung]
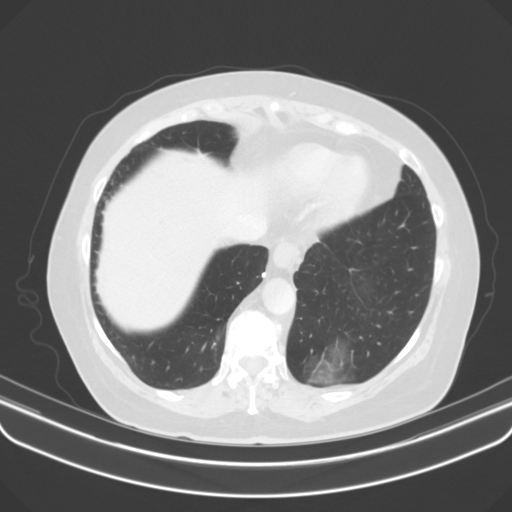

[Series 9: sag portal · sagittal · portal-venous · 0.42mm/px · 1 of 111 slices shown, 2 images]
[im 37/111  soft-tissue]
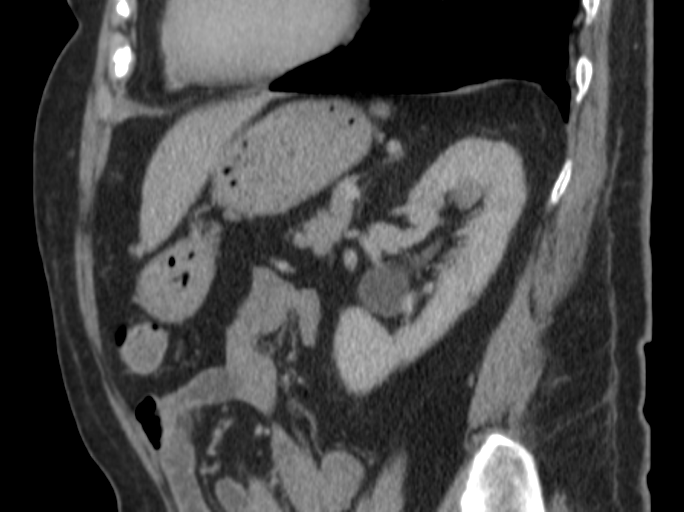
[im 37/111  bone]
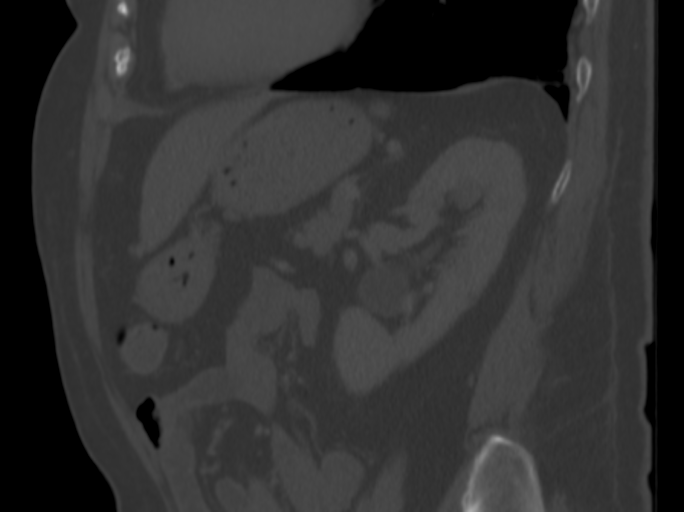

[9 of 46 positions shown; findings below may reference images not displayed]

Count of known CT and Cardiac Nuclear Medicine studies performed in the previous 
12 months = 1.
FINDINGS: LUNG BASES: Right lower lobe granulomas. 
HEPATOBILIARY: Hepatic granulomas. No mass or biliary dilatation. No gallstones. 
SPLEEN: Splenic granulomas. Normal size spleen. 
PANCREAS: No ductal dilatation or mass.   
ADRENALS: No mass. 
KIDNEYS: There is stable appearing right renal cysts, largest measuring 6.5 cm. 
Left kidney unremarkable. Symmetric excretion from both kidneys into nondilated 
collecting systems bilaterally. 
LYMPH NODES: No adenopathy. 
STOMACH, SMALL BOWEL AND COLON: No bowel wall thickening or obstruction. 
VASCULAR STRUCTURES: No aneurysm.  
MUSCULOSKELETAL: No acute osseous abnormality. Scattered degenerative changes. 
T11 and L1 compression fracture deformities with vertebroplasty changes. 
Posterior cortical buckling from the superior endplates of both fractures. 
ADDITIONAL FINDINGS: None.
IMPRESSION: Stable right renal cysts. No solid mass lesion is identified. 
Other incidental findings. 
RADIATION DOSE REDUCTION: All CT scans are performed using radiation dose 
reduction techniques, when applicable.  Technical factors are evaluated and 
adjusted to ensure appropriate moderation of exposure.  Automated dose 
management technology is applied to adjust the radiation doses to minimize 
exposure while achieving diagnostic quality images.
